# Patient Record
Sex: Male | Born: 1940 | Race: Black or African American | Hispanic: No | Marital: Single | Smoking: Former smoker
Health system: Southern US, Community
[De-identification: ages and names within clinical notes are randomized; demographics above are authoritative.]

## PROBLEM LIST (undated history)

## (undated) DIAGNOSIS — K4090 Unilateral inguinal hernia, without obstruction or gangrene, not specified as recurrent: Secondary | ICD-10-CM

## (undated) DIAGNOSIS — N390 Urinary tract infection, site not specified: Secondary | ICD-10-CM

## (undated) DIAGNOSIS — E876 Hypokalemia: Secondary | ICD-10-CM

## (undated) DIAGNOSIS — H548 Legal blindness, as defined in USA: Secondary | ICD-10-CM

## (undated) DIAGNOSIS — N289 Disorder of kidney and ureter, unspecified: Secondary | ICD-10-CM

## (undated) DIAGNOSIS — R066 Hiccough: Secondary | ICD-10-CM

## (undated) DIAGNOSIS — N400 Enlarged prostate without lower urinary tract symptoms: Secondary | ICD-10-CM

## (undated) DIAGNOSIS — I6350 Cerebral infarction due to unspecified occlusion or stenosis of unspecified cerebral artery: Secondary | ICD-10-CM

## (undated) DIAGNOSIS — I10 Essential (primary) hypertension: Secondary | ICD-10-CM

## (undated) DIAGNOSIS — A419 Sepsis, unspecified organism: Secondary | ICD-10-CM

## (undated) DIAGNOSIS — I639 Cerebral infarction, unspecified: Secondary | ICD-10-CM

## (undated) HISTORY — DX: Essential (primary) hypertension: I10

## (undated) HISTORY — DX: Cerebral infarction, unspecified: I63.9

## (undated) HISTORY — DX: Hypokalemia: E87.6

## (undated) HISTORY — DX: Sepsis, unspecified organism: A41.9

## (undated) HISTORY — PX: HERNIA REPAIR: SHX51

## (undated) HISTORY — DX: Disorders of magnesium metabolism, unspecified: E83.40

## (undated) HISTORY — DX: Hiccough: R06.6

## (undated) HISTORY — PX: GLAUCOMA SURGERY: SHX656

## (undated) HISTORY — DX: Benign prostatic hyperplasia without lower urinary tract symptoms: N40.0

## (undated) HISTORY — DX: Urinary tract infection, site not specified: N39.0

## (undated) HISTORY — DX: Disorder of kidney and ureter, unspecified: N28.9

## (undated) HISTORY — DX: Unilateral inguinal hernia, without obstruction or gangrene, not specified as recurrent: K40.90

---

## 1999-10-28 ENCOUNTER — Encounter: Admission: RE | Admit: 1999-10-28 | Discharge: 1999-10-28 | Payer: Self-pay | Admitting: Hematology and Oncology

## 1999-11-19 ENCOUNTER — Encounter: Admission: RE | Admit: 1999-11-19 | Discharge: 1999-11-19 | Payer: Self-pay | Admitting: Internal Medicine

## 2000-03-22 ENCOUNTER — Encounter: Admission: RE | Admit: 2000-03-22 | Discharge: 2000-03-22 | Payer: Self-pay | Admitting: Hematology and Oncology

## 2000-07-19 ENCOUNTER — Encounter: Admission: RE | Admit: 2000-07-19 | Discharge: 2000-07-19 | Payer: Self-pay | Admitting: Internal Medicine

## 2000-11-18 ENCOUNTER — Encounter: Admission: RE | Admit: 2000-11-18 | Discharge: 2000-11-18 | Payer: Self-pay | Admitting: Internal Medicine

## 2001-04-25 ENCOUNTER — Encounter: Admission: RE | Admit: 2001-04-25 | Discharge: 2001-04-25 | Payer: Self-pay

## 2001-11-29 ENCOUNTER — Encounter: Admission: RE | Admit: 2001-11-29 | Discharge: 2001-11-29 | Payer: Self-pay | Admitting: Internal Medicine

## 2001-12-18 ENCOUNTER — Encounter: Admission: RE | Admit: 2001-12-18 | Discharge: 2001-12-18 | Payer: Self-pay | Admitting: Internal Medicine

## 2002-07-23 ENCOUNTER — Encounter: Admission: RE | Admit: 2002-07-23 | Discharge: 2002-07-23 | Payer: Self-pay | Admitting: Internal Medicine

## 2003-01-21 ENCOUNTER — Encounter: Admission: RE | Admit: 2003-01-21 | Discharge: 2003-01-21 | Payer: Self-pay | Admitting: Internal Medicine

## 2003-01-25 ENCOUNTER — Encounter: Admission: RE | Admit: 2003-01-25 | Discharge: 2003-01-25 | Payer: Self-pay | Admitting: Internal Medicine

## 2003-06-05 ENCOUNTER — Inpatient Hospital Stay (HOSPITAL_COMMUNITY): Admission: EM | Admit: 2003-06-05 | Discharge: 2003-06-07 | Payer: Self-pay | Admitting: Emergency Medicine

## 2003-06-06 ENCOUNTER — Encounter: Payer: Self-pay | Admitting: Cardiology

## 2003-06-21 ENCOUNTER — Encounter: Admission: RE | Admit: 2003-06-21 | Discharge: 2003-06-21 | Payer: Self-pay | Admitting: Internal Medicine

## 2003-06-24 ENCOUNTER — Encounter: Admission: RE | Admit: 2003-06-24 | Discharge: 2003-06-24 | Payer: Self-pay | Admitting: Internal Medicine

## 2003-06-25 ENCOUNTER — Encounter: Admission: RE | Admit: 2003-06-25 | Discharge: 2003-06-25 | Payer: Self-pay | Admitting: Internal Medicine

## 2003-08-06 ENCOUNTER — Encounter: Admission: RE | Admit: 2003-08-06 | Discharge: 2003-08-06 | Payer: Self-pay | Admitting: Internal Medicine

## 2003-11-18 ENCOUNTER — Ambulatory Visit: Payer: Self-pay | Admitting: Internal Medicine

## 2004-04-30 ENCOUNTER — Ambulatory Visit: Payer: Self-pay | Admitting: Internal Medicine

## 2004-08-05 ENCOUNTER — Ambulatory Visit: Payer: Self-pay | Admitting: Internal Medicine

## 2004-08-06 ENCOUNTER — Encounter (INDEPENDENT_AMBULATORY_CARE_PROVIDER_SITE_OTHER): Payer: Self-pay | Admitting: Pulmonary Disease

## 2005-04-07 ENCOUNTER — Ambulatory Visit: Payer: Self-pay | Admitting: Internal Medicine

## 2005-05-17 ENCOUNTER — Ambulatory Visit: Payer: Self-pay | Admitting: Internal Medicine

## 2005-10-29 ENCOUNTER — Ambulatory Visit: Payer: Self-pay | Admitting: Internal Medicine

## 2005-10-29 ENCOUNTER — Encounter (INDEPENDENT_AMBULATORY_CARE_PROVIDER_SITE_OTHER): Payer: Self-pay | Admitting: Pulmonary Disease

## 2005-10-29 LAB — CONVERTED CEMR LAB
ALT: 16 units/L (ref 0–40)
AST: 29 units/L (ref 0–37)
BUN: 15 mg/dL (ref 6–23)
CO2: 27 meq/L (ref 19–32)
Calcium: 9.6 mg/dL (ref 8.4–10.5)
Glucose, Bld: 146 mg/dL — ABNORMAL HIGH (ref 70–99)
HCT: 45.1 % (ref 39.0–52.0)
Hemoglobin: 15.2 g/dL (ref 13.0–17.0)
Leukocyte count, blood: 14.6 10*9/L — ABNORMAL HIGH (ref 4.0–10.5)
MCHC: 33.7 g/dL (ref 30.0–36.0)
PSA: 2.35 ng/mL (ref 0.10–4.00)
RBC: 5.04 M/uL (ref 4.22–5.81)
TSH: 3.98 microintl units/mL (ref 0.350–5.50)
Total Protein: 7.9 g/dL (ref 6.0–8.3)

## 2005-11-22 ENCOUNTER — Encounter (INDEPENDENT_AMBULATORY_CARE_PROVIDER_SITE_OTHER): Payer: Self-pay | Admitting: Pulmonary Disease

## 2005-11-22 ENCOUNTER — Ambulatory Visit: Payer: Self-pay | Admitting: Hospitalist

## 2005-11-22 LAB — CONVERTED CEMR LAB: BUN: 20 mg/dL (ref 6–23)

## 2005-12-28 ENCOUNTER — Encounter (INDEPENDENT_AMBULATORY_CARE_PROVIDER_SITE_OTHER): Payer: Self-pay | Admitting: Pulmonary Disease

## 2005-12-28 DIAGNOSIS — I10 Essential (primary) hypertension: Secondary | ICD-10-CM

## 2005-12-28 DIAGNOSIS — N183 Chronic kidney disease, stage 3 (moderate): Secondary | ICD-10-CM

## 2005-12-28 DIAGNOSIS — K469 Unspecified abdominal hernia without obstruction or gangrene: Secondary | ICD-10-CM | POA: Insufficient documentation

## 2005-12-28 DIAGNOSIS — I498 Other specified cardiac arrhythmias: Secondary | ICD-10-CM

## 2005-12-28 DIAGNOSIS — I6350 Cerebral infarction due to unspecified occlusion or stenosis of unspecified cerebral artery: Secondary | ICD-10-CM

## 2005-12-28 DIAGNOSIS — K029 Dental caries, unspecified: Secondary | ICD-10-CM | POA: Insufficient documentation

## 2005-12-28 DIAGNOSIS — H548 Legal blindness, as defined in USA: Secondary | ICD-10-CM | POA: Insufficient documentation

## 2005-12-28 DIAGNOSIS — H409 Unspecified glaucoma: Secondary | ICD-10-CM | POA: Insufficient documentation

## 2005-12-28 HISTORY — DX: Cerebral infarction due to unspecified occlusion or stenosis of unspecified cerebral artery: I63.50

## 2006-03-31 ENCOUNTER — Encounter (INDEPENDENT_AMBULATORY_CARE_PROVIDER_SITE_OTHER): Payer: Self-pay | Admitting: Pulmonary Disease

## 2006-03-31 ENCOUNTER — Ambulatory Visit: Payer: Self-pay | Admitting: Internal Medicine

## 2006-03-31 DIAGNOSIS — Z862 Personal history of diseases of the blood and blood-forming organs and certain disorders involving the immune mechanism: Secondary | ICD-10-CM

## 2006-03-31 DIAGNOSIS — Z8639 Personal history of other endocrine, nutritional and metabolic disease: Secondary | ICD-10-CM

## 2006-03-31 HISTORY — DX: Disorders of magnesium metabolism, unspecified: E83.40

## 2006-03-31 LAB — CONVERTED CEMR LAB
Calcium: 10.4 mg/dL (ref 8.4–10.5)
Glucose, Bld: 103 mg/dL — ABNORMAL HIGH (ref 70–99)
Potassium: 3.5 meq/L (ref 3.5–5.3)

## 2006-04-06 ENCOUNTER — Ambulatory Visit: Payer: Self-pay | Admitting: Internal Medicine

## 2006-04-06 ENCOUNTER — Encounter (INDEPENDENT_AMBULATORY_CARE_PROVIDER_SITE_OTHER): Payer: Self-pay | Admitting: Pulmonary Disease

## 2006-04-06 LAB — CONVERTED CEMR LAB
Cholesterol: 143 mg/dL (ref 0–200)
HDL: 40 mg/dL (ref >39)
LDL Cholesterol: 75 mg/dL (ref 0–99)
Total CHOL/HDL Ratio: 3.6
Triglycerides: 138 mg/dL (ref <150)
VLDL: 28 mg/dL (ref 0–40)

## 2006-04-25 ENCOUNTER — Telehealth (INDEPENDENT_AMBULATORY_CARE_PROVIDER_SITE_OTHER): Payer: Self-pay | Admitting: *Deleted

## 2006-06-29 ENCOUNTER — Telehealth (INDEPENDENT_AMBULATORY_CARE_PROVIDER_SITE_OTHER): Payer: Self-pay | Admitting: *Deleted

## 2006-08-04 ENCOUNTER — Ambulatory Visit: Payer: Self-pay | Admitting: Internal Medicine

## 2006-08-04 ENCOUNTER — Encounter (INDEPENDENT_AMBULATORY_CARE_PROVIDER_SITE_OTHER): Payer: Self-pay | Admitting: *Deleted

## 2006-08-04 DIAGNOSIS — N400 Enlarged prostate without lower urinary tract symptoms: Secondary | ICD-10-CM

## 2006-08-04 LAB — CONVERTED CEMR LAB
CO2: 23 meq/L (ref 19–32)
Chloride: 99 meq/L (ref 96–112)
Creatinine, Ser: 1.64 mg/dL — ABNORMAL HIGH (ref 0.40–1.50)
Glucose, Bld: 74 mg/dL (ref 70–99)
Potassium: 3.5 meq/L (ref 3.5–5.3)
Sodium: 139 meq/L (ref 135–145)
TSH: 2.494 microintl units/mL (ref 0.350–5.50)

## 2006-10-25 ENCOUNTER — Telehealth (INDEPENDENT_AMBULATORY_CARE_PROVIDER_SITE_OTHER): Payer: Self-pay | Admitting: *Deleted

## 2006-11-25 ENCOUNTER — Telehealth: Payer: Self-pay | Admitting: *Deleted

## 2006-12-16 ENCOUNTER — Ambulatory Visit: Payer: Self-pay | Admitting: Hospitalist

## 2006-12-16 ENCOUNTER — Encounter (INDEPENDENT_AMBULATORY_CARE_PROVIDER_SITE_OTHER): Payer: Self-pay | Admitting: *Deleted

## 2006-12-16 LAB — CONVERTED CEMR LAB
AST: 23 units/L (ref 0–37)
Basophils Relative: 0 % (ref 0–1)
CO2: 22 meq/L (ref 19–32)
Calcium: 10 mg/dL (ref 8.4–10.5)
Chloride: 102 meq/L (ref 96–112)
Creatinine, Ser: 1.41 mg/dL (ref 0.40–1.50)
HDL: 46 mg/dL (ref >39)
Hemoglobin: 15.1 g/dL (ref 13.0–17.0)
MCV: 86.1 fL (ref 78.0–100.0)
Monocytes Absolute: 0.9 10*3/uL (ref 0.1–1.0)
Platelets: 253 10*3/uL (ref 150–400)
Total Protein: 8.3 g/dL (ref 6.0–8.3)
VLDL: 38 mg/dL (ref 0–40)

## 2007-06-08 ENCOUNTER — Encounter (INDEPENDENT_AMBULATORY_CARE_PROVIDER_SITE_OTHER): Payer: Self-pay | Admitting: *Deleted

## 2007-06-08 ENCOUNTER — Ambulatory Visit: Payer: Self-pay | Admitting: Internal Medicine

## 2007-06-11 LAB — CONVERTED CEMR LAB
BUN: 22 mg/dL (ref 6–23)
CO2: 23 meq/L (ref 19–32)
Cholesterol: 153 mg/dL (ref 0–200)
Creatinine, Ser: 1.42 mg/dL (ref 0.40–1.50)
Glucose, Bld: 108 mg/dL — ABNORMAL HIGH (ref 70–99)
LDL Cholesterol: 76 mg/dL (ref 0–99)
Sodium: 140 meq/L (ref 135–145)
Triglycerides: 177 mg/dL — ABNORMAL HIGH (ref <150)

## 2007-11-20 ENCOUNTER — Telehealth (INDEPENDENT_AMBULATORY_CARE_PROVIDER_SITE_OTHER): Payer: Self-pay | Admitting: *Deleted

## 2008-01-10 ENCOUNTER — Encounter (INDEPENDENT_AMBULATORY_CARE_PROVIDER_SITE_OTHER): Payer: Self-pay | Admitting: *Deleted

## 2008-01-10 ENCOUNTER — Ambulatory Visit: Payer: Self-pay | Admitting: Infectious Diseases

## 2008-01-10 LAB — CONVERTED CEMR LAB
CO2: 23 meq/L (ref 19–32)
Chloride: 100 meq/L (ref 96–112)
Potassium: 3.6 meq/L (ref 3.5–5.3)
Sodium: 138 meq/L (ref 135–145)

## 2008-08-19 ENCOUNTER — Encounter: Payer: Self-pay | Admitting: Internal Medicine

## 2008-08-19 ENCOUNTER — Ambulatory Visit: Payer: Self-pay | Admitting: Internal Medicine

## 2008-08-19 LAB — CONVERTED CEMR LAB
AST: 23 units/L (ref 0–37)
BUN: 14 mg/dL (ref 6–23)
Creatinine, Ser: 1.4 mg/dL (ref 0.40–1.50)
Glucose, Bld: 96 mg/dL (ref 70–99)
HCT: 42.6 % (ref 39.0–52.0)
HDL: 46 mg/dL (ref >39)
LDL Cholesterol: 83 mg/dL (ref 0–99)
MCV: 85 fL (ref >=78.0)
Magnesium: 1.9 mg/dL (ref 1.5–2.5)
Platelets: 282 10*3/uL (ref 150–400)
RDW: 13.8 % (ref 11.5–15.5)
Sodium: 140 meq/L (ref 135–145)
Total Bilirubin: 0.6 mg/dL (ref 0.3–1.2)
Total Protein: 7.9 g/dL (ref 6.0–8.3)
WBC: 9.1 10*3/uL (ref 4.0–10.5)

## 2008-08-20 ENCOUNTER — Telehealth: Payer: Self-pay | Admitting: Internal Medicine

## 2008-11-12 ENCOUNTER — Telehealth: Payer: Self-pay | Admitting: Internal Medicine

## 2009-01-14 ENCOUNTER — Telehealth: Payer: Self-pay | Admitting: Internal Medicine

## 2009-01-15 ENCOUNTER — Ambulatory Visit: Payer: Self-pay | Admitting: Internal Medicine

## 2009-01-15 DIAGNOSIS — R066 Hiccough: Secondary | ICD-10-CM | POA: Insufficient documentation

## 2009-01-15 DIAGNOSIS — R05 Cough: Secondary | ICD-10-CM | POA: Insufficient documentation

## 2009-08-12 ENCOUNTER — Telehealth: Payer: Self-pay | Admitting: Internal Medicine

## 2009-08-21 ENCOUNTER — Ambulatory Visit: Payer: Self-pay | Admitting: Internal Medicine

## 2009-08-21 LAB — CONVERTED CEMR LAB
Albumin: 4.4 g/dL (ref 3.5–5.2)
Alkaline Phosphatase: 70 units/L (ref 39–117)
CO2: 23 meq/L (ref 19–32)
Chloride: 100 meq/L (ref 96–112)
Cholesterol: 172 mg/dL (ref 0–200)
Creatinine, Ser: 1.47 mg/dL (ref 0.40–1.50)
Hemoglobin: 14.9 g/dL (ref 13.0–17.0)
LDL Cholesterol: 94 mg/dL (ref 0–99)
MCHC: 34.3 g/dL (ref 30.0–36.0)
MCV: 85.3 fL (ref >=78.0)
Platelets: 265 10*3/uL (ref 150–400)
Potassium: 3.7 meq/L (ref 3.5–5.3)
Sodium: 137 meq/L (ref 135–145)
Total Bilirubin: 0.5 mg/dL (ref 0.3–1.2)
Total CHOL/HDL Ratio: 3.9
Total Protein: 7.9 g/dL (ref 6.0–8.3)
Triglycerides: 170 mg/dL — ABNORMAL HIGH (ref <150)

## 2009-08-25 ENCOUNTER — Telehealth: Payer: Self-pay | Admitting: Internal Medicine

## 2010-01-27 ENCOUNTER — Telehealth: Payer: Self-pay | Admitting: Internal Medicine

## 2010-02-10 NOTE — Assessment & Plan Note (Signed)
Summary: est-ck/fu/meds/cfb   Vital Signs:  Patient profile:   70 year old male Height:      70 inches Weight:      180.3 pounds BMI:     25.96 Temp:     97.8 degrees F oral Pulse rate:   96 / minute BP sitting:   118 / 74  (right arm)  Vitals Entered By: Filomena Jungling NT II (August 21, 2009 4:00 PM) CC: COUGH/ NEED FORM FILLED Is Patient Diabetic? No Pain Assessment Patient in pain? no      Nutritional Status BMI of 25 - 29 = overweight  Have you ever been in a relationship where you felt threatened, hurt or afraid?No   Does patient need assistance? Ambulation Impaired:Risk for fall, Wheelchair Comments sister helps with patient,patient is blind   CC:  COUGH/ NEED FORM FILLED.  History of Present Illness: 70 yr old man with pmhx as described below comes to the clinic complaining of seasonal dry cough. Patient usually gets a cough suppresant that relieves it.  Denies fever or chills, shortness of breath, chest pain, abdominal pain, or diarrhea.   Preventive Screening-Counseling & Management  Alcohol-Tobacco     Smoking Status: quit     Packs/Day: 30Pyh     Year Quit: 40 years ago  Caffeine-Diet-Exercise     Caffeine use/day: 4     Does Patient Exercise: yes     Type of exercise: range of motion  Problems Prior to Update: 1)  Hiccups, Chronic  (ICD-786.8) 2)  Cough  (ICD-786.2) 3)  Benign Prostatic Hypertrophy, Hx of  (ICD-V13.8) 4)  Preventive Health Care  (ICD-V70.0) 5)  Hypomagnesemia  (ICD-275.2) 6)  Hypokalemia, Hx of  (ICD-V12.2) 7)  Dental Caries  (ICD-521.00) 8)  Sinus Tachycardia  (ICD-427.89) 9)  Hernia  (ICD-553.9) 10)  Cva  (ICD-434.91) 11)  Blindness  (ICD-369.4) 12)  Glaucoma  (ICD-365.9) 13)  Renal Insufficiency  (ICD-588.9) 14)  Hypertension  (ICD-401.9)  Medications Prior to Update: 1)  Toprol Xl 100 Mg Tb24 (Metoprolol Succinate) .... Take 1 Tablet By Mouth Once A Day 2)  Accupril 40 Mg Tabs (Quinapril Hcl) .... Take 1 Tablet By Mouth  Once A Day 3)  Norvasc 10 Mg Tabs (Amlodipine Besylate) .... Take 1 Tablet By Mouth Once A Day 4)  Chlorpromazine Hcl 25 Mg Tabs (Chlorpromazine Hcl) .... Take 25mg  By Mouth Three Times Daily As Needed For Hiccups. 5)  Maxzide-25 37.5-25 Mg Tabs (Triamterene-Hctz) .... Take 1 Tablet By Mouth Once A Day  Current Medications (verified): 1)  Toprol Xl 100 Mg Tb24 (Metoprolol Succinate) .... Take 1 Tablet By Mouth Once A Day 2)  Accupril 40 Mg Tabs (Quinapril Hcl) .... Take 1 Tablet By Mouth Once A Day 3)  Norvasc 10 Mg Tabs (Amlodipine Besylate) .... Take 1 Tablet By Mouth Once A Day 4)  Chlorpromazine Hcl 25 Mg Tabs (Chlorpromazine Hcl) .... Take 25mg  By Mouth Three Times Daily As Needed For Hiccups. 5)  Maxzide-25 37.5-25 Mg Tabs (Triamterene-Hctz) .... Take 1 Tablet By Mouth Once A Day  Allergies: No Known Drug Allergies  Past History:  Past Medical History: Last updated: 12/28/2005 Hypertension Renal insufficiency (bseline Cr. 1.5)  Sinus Tachycardia (120BPM) Poor Dentition CVA Glaucoma Blindness Hernia   Risk Factors: Caffeine Use: 4 (08/21/2009) Exercise: yes (08/21/2009)  Risk Factors: Smoking Status: quit (08/21/2009) Packs/Day: 30Pyh (08/21/2009)  Review of Systems  The patient denies fever, chest pain, dyspnea on exertion, peripheral edema, headaches, hemoptysis, abdominal pain, melena, hematochezia, hematuria,  and difficulty walking.    Physical Exam  General:  NAD Eyes:  blind  Mouth:  pharynx pink and moist, no erythema, and no exudates.   Neck:  supple and no cervical lymphadenopathy.   Lungs:  Normal respiratory effort, chest expands symmetrically. Lungs are clear to auscultation, no crackles or wheezes. Heart:  Normal rate and regular rhythm. S1 and S2 normal without gallop, murmur, click, rub or other extra sounds. Abdomen:  soft, non-tender, and normal bowel sounds.   Extremities:  no edema   Impression & Recommendations:  Problem # 1:  COUGH  (ICD-786.2) Most likely 2/2 post nasal drip. Will give an antihistamine for symptom relief and reasses on follow up. Instructed to return to the clinic if cough worsens. Will follow up and consider further managment and imaging if indicated.  Problem # 2:  HYPERTENSION (ICD-401.9) Stable. Continue current regimen.  His updated medication list for this problem includes:    Toprol Xl 100 Mg Tb24 (Metoprolol succinate) .Marland Kitchen... Take 1 tablet by mouth once a day    Accupril 40 Mg Tabs (Quinapril hcl) .Marland Kitchen... Take 1 tablet by mouth once a day    Norvasc 10 Mg Tabs (Amlodipine besylate) .Marland Kitchen... Take 1 tablet by mouth once a day    Maxzide-25 37.5-25 Mg Tabs (Triamterene-hctz) .Marland Kitchen... Take 1 tablet by mouth once a day  Orders: T-CBC No Diff ([id number]) T-CMP with Estimated GFR ([id number])  BP today: 118/74 Prior BP: 116/83 (01/15/2009)  Labs Reviewed: K+: 3.7 (08/19/2008) Creat: : 1.40 (08/19/2008)   Chol: 156 (08/19/2008)   HDL: 46 (08/19/2008)   LDL: 83 (08/19/2008)   TG: 133 (08/19/2008)  Problem # 3:  HYPOKALEMIA, HX OF (ICD-V12.2) Check labs and reassess.  Problem # 4:  RENAL INSUFFICIENCY (ICD-588.9) Check renal function and reassess.  Problem # 5:  Preventive Health Care (ICD-V70.0) Check Fasting lipid panel and routine lab work. Patient reports to have had a Colonoscopy in the past which per his report was normal. Will get records.   Complete Medication List: 1)  Toprol Xl 100 Mg Tb24 (Metoprolol succinate) .... Take 1 tablet by mouth once a day 2)  Accupril 40 Mg Tabs (Quinapril hcl) .... Take 1 tablet by mouth once a day 3)  Norvasc 10 Mg Tabs (Amlodipine besylate) .... Take 1 tablet by mouth once a day 4)  Chlorpromazine Hcl 25 Mg Tabs (Chlorpromazine hcl) .... Take 25mg  by mouth three times daily as needed for hiccups. 5)  Maxzide-25 37.5-25 Mg Tabs (Triamterene-hctz) .... Take 1 tablet by mouth once a day 6)  Aller-chlor 2 Mg/62ml Syrp (Chlorpheniramine maleate) .... Take 10ml by  mouth q 6 hrs as needed for cough  Other Orders: T-Lipid Profile ([id number])  Patient Instructions: 1)  Please schedule a follow-up appointment in 6 months. 2)  Take all medication as directed. Prescriptions: ALLER-CHLOR 2 MG/5ML SYRP (CHLORPHENIRAMINE MALEATE) Take 10ml by mouth q 6 hrs as needed for cough  #118 ml x 0   Entered and Authorized by:   Laren Everts MD   Signed by:   Laren Everts MD on 08/25/2009   Method used:   Electronically to        [facility] [address] (retail)       [phone]       [place], [place]  [address]       Ph: [phone]       Fax: [phone]   RxID:   [id number]  PENNKINETIC ER 8-10 MG/5ML LQCR (CHLORPHENIRAMINE-HYDROCODONE) Take 5ml by mouth two times a day as needed coughing  #77ml x 1   Entered and Authorized by:   Laren Everts MD   Signed by:   Laren Everts MD on 08/21/2009   Method used:   Print then Give to Patient   RxID:   [id number] MAXZIDE-25 37.5-25 MG TABS (TRIAMTERENE-HCTZ) Take 1 tablet by mouth once a day  #30 x 6   Entered and Authorized by:   Laren Everts MD   Signed by:   Laren Everts MD on 08/21/2009   Method used:   Electronically to        [facility] [address] (retail)       [phone]       [place], [place]  [address]       Ph: [phone]       Fax: [phone]   RxID:   [id number] CHLORPROMAZINE HCL 25 MG TABS (CHLORPROMAZINE HCL) Take 25mg  by mouth three times daily as needed for hiccups.  #90 x 5   Entered and Authorized by:   Laren Everts MD   Signed by:   Laren Everts MD on 08/21/2009   Method used:   Electronically to        [facility] [address] (retail)       [phone]       [place], [place]  [address]       Ph: [phone]       Fax: [phone]   RxID:   [id number] NORVASC 10 MG  TABS (AMLODIPINE BESYLATE) Take 1 tablet by mouth once a day  #30 x 11   Entered and Authorized by:   Laren Everts MD   Signed by:   Laren Everts MD on 08/21/2009   Method used:   Electronically to        [facility] [address] (retail)       [phone]       [place], [place]  [address]       Ph: [phone]       Fax: [phone]   RxID:   [id number] ACCUPRIL 40 MG TABS (QUINAPRIL HCL) Take 1 tablet by mouth once a day  #30 Each x 11   Entered and Authorized by:   Laren Everts MD   Signed by:   Laren Everts MD on 08/21/2009   Method used:   Electronically to        [facility] [address] (retail)       [phone]       [place], [place]  [address]       Ph: [phone]       Fax: [phone]   RxID:   [id number] TOPROL XL 100 MG TB24 (METOPROLOL SUCCINATE) Take 1 tablet by mouth once a day  #30 x 11   Entered and Authorized by:   Laren Everts MD   Signed by:   Laren Everts MD on 08/21/2009   Method used:   Electronically to        [facility] [address] (retail)       [phone]       [place], [place]  [address]  Ph: [phone]       Fax: [phone]   RxID:   [id number]   Prevention & Chronic Care Immunizations   Influenza vaccine: Fluvax MCR  (01/15/2009)    Tetanus booster: Not documented    Pneumococcal vaccine: Not documented    H. zoster vaccine: Not documented  Colorectal Screening   Hemoccult: Not documented    Colonoscopy: Not documented  Other Screening   PSA: 2.34  (08/04/2006)   Smoking status: quit  (08/21/2009)  Lipids   Total Cholesterol: 156  (08/19/2008)   LDL: 83  (08/19/2008)   LDL Direct: Not documented   HDL: 46  (08/19/2008)   Triglycerides: 133  (08/19/2008)  Hypertension   Last Blood Pressure: 118 / 74  (08/21/2009)   Serum creatinine: 1.40   (08/19/2008)   Serum potassium 3.7  (08/19/2008)    Hypertension flowsheet reviewed?: Yes   Progress toward BP goal: At goal  Self-Management Support :   Personal Goals (by the next clinic visit) :      Personal blood pressure goal: 130/80  (01/15/2009)   Patient will work on the following items until the next clinic visit to reach self-care goals:     Medications and monitoring: take my medicines every day  (08/21/2009)     Eating: drink diet soda or water instead of juice or soda, eat more vegetables, use fresh or frozen vegetables, eat foods that are low in salt, eat baked foods instead of fried foods, eat fruit for snacks and desserts  (08/21/2009)     Activity: take a 30 minute walk every day  (08/21/2009)    Hypertension self-management support: Education handout, Written self-care plan  (08/21/2009)   Hypertension self-care plan printed.   Hypertension education handout printed   Process Orders Check Orders Results:     [facility]: Check successful Tests Sent for requisitioning (August 25, 2009 6:08 PM):     08/21/2009: [facility] -- T-CBC No Diff [[id number]] (signed)     08/21/2009: [facility] -- T-CMP with Estimated GFR [[id number]] (signed)     08/21/2009: [facility] -- T-Lipid Profile [id number] (signed)

## 2010-02-10 NOTE — Progress Notes (Signed)
Summary: Refill/gh  Phone Note Refill Request Message from:  Fax from Pharmacy on January 14, 2009 4:11 PM  Refills Requested: Medication #1:  MAXZIDE-25 37.5-25 MG TABS Take 1 tablet by mouth once a day.   Last Refilled: 12/11/2008  Method Requested: Electronic Initial call taken by: Angelina Ok RN,  January 14, 2009 4:11 PM    Prescriptions: MAXZIDE-25 37.5-25 MG TABS (TRIAMTERENE-HCTZ) Take 1 tablet by mouth once a day  #30 x 6   Entered and Authorized by:   Laren Everts MD   Signed by:   Laren Everts MD on 01/15/2009   Method used:   Electronically to        [facility] [address] (retail)       [phone]       [place], [place]  [address]       Ph: [phone]       Fax: [phone]   RxID:   [id number]

## 2010-02-10 NOTE — Assessment & Plan Note (Signed)
Summary: f/u [mkj[   Vital Signs:  Patient profile:   70 year old male Height:      70 inches (177.80 cm) Weight:      181.0 pounds (82.27 kg) BMI:     26.06 Temp:     98.1 degrees F (36.72 degrees C) oral Pulse rate:   83 / minute BP sitting:   116 / 83  (left arm)  Vitals Entered By: Stanton Kidney Ditzler RN (January 15, 2009 3:57 PM) Is Patient Diabetic? No Pain Assessment Patient in pain? no      Nutritional Status BMI of 25 - 29 = overweight Nutritional Status Detail appetite good  Have you ever been in a relationship where you felt threatened, hurt or afraid?denies   Does patient need assistance? Functional Status Self care Ambulation Impaired:Risk for fall, Wheelchair Comments In w/c - uses a cane. Pt blind. Sister with pt also she is caregiver. Refills on meds and nonproductive cough mostly at night for past week. Wants labs.   History of Present Illness: 70 yr old man with pmhx as described below comes to the clinic follow up. Patient reports to have a dry cough about a week ago. Denies any fever, chills, chest pain, abdominal pain, palpitations, diarrhea, or constipation.  Depression History:      The patient denies a depressed mood most of the day and a diminished interest in his usual daily activities.         Preventive Screening-Counseling & Management  Alcohol-Tobacco     Smoking Status: quit     Packs/Day: 30Pyh     Year Quit: 40 years ago  Caffeine-Diet-Exercise     Caffeine use/day: 4     Does Patient Exercise: yes     Type of exercise: range of motion  Problems Prior to Update: 1)  Benign Prostatic Hypertrophy, Hx of  (ICD-V13.8) 2)  Preventive Health Care  (ICD-V70.0) 3)  Hypomagnesemia  (ICD-275.2) 4)  Hypokalemia, Hx of  (ICD-V12.2) 5)  Dental Caries  (ICD-521.00) 6)  Sinus Tachycardia  (ICD-427.89) 7)  Hernia  (ICD-553.9) 8)  Cva  (ICD-434.91) 9)  Blindness  (ICD-369.4) 10)  Glaucoma  (ICD-365.9) 11)  Renal Insufficiency  (ICD-588.9) 12)   Hypertension  (ICD-401.9)  Medications Prior to Update: 1)  Toprol Xl 100 Mg Tb24 (Metoprolol Succinate) .... Take 1 Tablet By Mouth Once A Day 2)  Accupril 40 Mg Tabs (Quinapril Hcl) .... Take 1 Tablet By Mouth Once A Day 3)  Norvasc 10 Mg Tabs (Amlodipine Besylate) .... Take 1 Tablet By Mouth Once A Day 4)  Chlorpromazine Hcl 25 Mg Tabs (Chlorpromazine Hcl) .... Take 25mg  By Mouth Three Times Daily As Needed For Hiccups. 5)  Maxzide-25 37.5-25 Mg Tabs (Triamterene-Hctz) .... Take 1 Tablet By Mouth Once A Day  Current Medications (verified): 1)  Toprol Xl 100 Mg Tb24 (Metoprolol Succinate) .... Take 1 Tablet By Mouth Once A Day 2)  Accupril 40 Mg Tabs (Quinapril Hcl) .... Take 1 Tablet By Mouth Once A Day 3)  Norvasc 10 Mg Tabs (Amlodipine Besylate) .... Take 1 Tablet By Mouth Once A Day 4)  Chlorpromazine Hcl 25 Mg Tabs (Chlorpromazine Hcl) .... Take 25mg  By Mouth Three Times Daily As Needed For Hiccups. 5)  Maxzide-25 37.5-25 Mg Tabs (Triamterene-Hctz) .... Take 1 Tablet By Mouth Once A Day  Allergies: No Known Drug Allergies  Past History:  Past Medical History: Last updated: 12/28/2005 Hypertension Renal insufficiency (bseline Cr. 1.5)  Sinus Tachycardia (120BPM) Poor Dentition CVA  Glaucoma Blindness Hernia   Risk Factors: Caffeine Use: 4 (01/15/2009) Exercise: yes (01/15/2009)  Risk Factors: Smoking Status: quit (01/15/2009) Packs/Day: 30Pyh (01/15/2009)  Review of Systems  The patient denies fever, chest pain, syncope, dyspnea on exertion, peripheral edema, headaches, hemoptysis, abdominal pain, melena, hematochezia, hematuria, and muscle weakness.    Physical Exam  General:  NAD Head:  normocephalic and atraumatic.   Eyes:  blind  Ears:  no external deformities.   Nose:  no external deformity.   Neck:  supple and full ROM.   Lungs:  Normal respiratory effort, chest expands symmetrically. Lungs are clear to auscultation, no crackles or wheezes. Heart:   Normal rate and regular rhythm. S1 and S2 normal without gallop, murmur, click, rub or other extra sounds. Abdomen:  soft, non-tender, and normal bowel sounds.   Extremities:  no edema Neurologic:  alert & oriented X3.     Impression & Recommendations:  Problem # 1:  HYPERTENSION (ICD-401.9) Controlled. Continue current regimen.  His updated medication list for this problem includes:    Toprol Xl 100 Mg Tb24 (Metoprolol succinate) .Marland Kitchen... Take 1 tablet by mouth once a day    Accupril 40 Mg Tabs (Quinapril hcl) .Marland Kitchen... Take 1 tablet by mouth once a day    Norvasc 10 Mg Tabs (Amlodipine besylate) .Marland Kitchen... Take 1 tablet by mouth once a day    Maxzide-25 37.5-25 Mg Tabs (Triamterene-hctz) .Marland Kitchen... Take 1 tablet by mouth once a day  BP today: 116/83 Prior BP: 135/92 (08/19/2008)  Labs Reviewed: K+: 3.7 (08/19/2008) Creat: : 1.40 (08/19/2008)   Chol: 156 (08/19/2008)   HDL: 46 (08/19/2008)   LDL: 83 (08/19/2008)   TG: 133 (08/19/2008)  Problem # 2:  RENAL INSUFFICIENCY (ICD-588.9) Stable. Will continue to monitor renal function on subsequent visit.  Problem # 3:  COUGH (ICD-786.2) Likely 2/2 viral URTI. Patient was instructed to drink plenty of fluids and to treat cough with mucinex if needed.   Problem # 4:  HICCUPS, CHRONIC (ICD-786.8) Continue chlorpromazine.   Problem # 5:  PREVENTIVE HEALTH CARE (ICD-V70.0) Reviewed preventive care protocols, and updated immunizations. Flu shot given. Will give Pneumovax on follow up.  Complete Medication List: 1)  Toprol Xl 100 Mg Tb24 (Metoprolol succinate) .... Take 1 tablet by mouth once a day 2)  Accupril 40 Mg Tabs (Quinapril hcl) .... Take 1 tablet by mouth once a day 3)  Norvasc 10 Mg Tabs (Amlodipine besylate) .... Take 1 tablet by mouth once a day 4)  Chlorpromazine Hcl 25 Mg Tabs (Chlorpromazine hcl) .... Take 25mg  by mouth three times daily as needed for hiccups. 5)  Maxzide-25 37.5-25 Mg Tabs (Triamterene-hctz) .... Take 1 tablet by mouth  once a day  Other Orders: Influenza Vaccine MCR ([id number])  Patient Instructions: 1)  Please schedule a follow-up appointment in 6 months. 2)  Continue to take all medication as directed. Prescriptions: CHLORPROMAZINE HCL 25 MG TABS (CHLORPROMAZINE HCL) Take 25mg  by mouth three times daily as needed for hiccups.  #90 x 5   Entered and Authorized by:   Laren Everts MD   Signed by:   Laren Everts MD on 01/15/2009   Method used:   Electronically to        [facility] [address] (retail)       [phone]       [place], [place]  [address]       Ph: [phone]  Fax: [phone]   RxID:   [id number] NORVASC 10 MG TABS (AMLODIPINE BESYLATE) Take 1 tablet by mouth once a day  #30 x 11   Entered and Authorized by:   Laren Everts MD   Signed by:   Laren Everts MD on 01/15/2009   Method used:   Electronically to        [facility] [address] (retail)       [phone]       [place], [place]  [address]       Ph: [phone]       Fax: [phone]   RxID:   [id number] ACCUPRIL 40 MG TABS (QUINAPRIL HCL) Take 1 tablet by mouth once a day  #30 Each x 11   Entered and Authorized by:   Laren Everts MD   Signed by:   Laren Everts MD on 01/15/2009   Method used:   Electronically to        [facility] [address] (retail)       [phone]       [place], [place]  [address]       Ph: [phone]       Fax: [phone]   RxID:   [id number]    Prevention & Chronic Care Immunizations   Influenza vaccine: Fluvax MCR  (01/15/2009)    Tetanus booster: Not documented    Pneumococcal vaccine: Not documented    H. zoster vaccine: Not documented  Colorectal Screening   Hemoccult: Not documented    Colonoscopy: Not documented  Other Screening   PSA: 2.34  (08/04/2006)   Smoking status: quit   (01/15/2009)  Lipids   Total Cholesterol: 156  (08/19/2008)   LDL: 83  (08/19/2008)   LDL Direct: Not documented   HDL: 46  (08/19/2008)   Triglycerides: 133  (08/19/2008)  Hypertension   Last Blood Pressure: 116 / 83  (01/15/2009)   Serum creatinine: 1.40  (08/19/2008)   Serum potassium 3.7  (08/19/2008)    Hypertension flowsheet reviewed?: Yes   Progress toward BP goal: At goal  Self-Management Support :   Personal Goals (by the next clinic visit) :      Personal blood pressure goal: 130/80  (01/15/2009)   Patient will work on the following items until the next clinic visit to reach self-care goals:     Medications and monitoring: take my medicines every day, check my blood pressure, bring all of my medications to every visit, weigh myself weekly  (01/15/2009)     Eating: eat more vegetables, use fresh or frozen vegetables, eat foods that are low in salt, eat baked foods instead of fried foods, eat fruit for snacks and desserts, limit or avoid alcohol  (01/15/2009)     Activity: take a 30 minute walk every day, take the stairs instead of the elevator, park at the far end of the parking lot  (01/15/2009)    Hypertension self-management support: Written self-care plan  (01/15/2009)   Hypertension self-care plan printed.   Nursing Instructions: Give Flu vaccine today    Influenza Vaccine    Vaccine Type: Fluvax MCR    Site: left deltoid    Mfr: novartis    Dose: 0.5 ml    Route: IM    Given by: Stanton Kidney Ditzler RN    Exp. Date: 03/11/2009    Lot #: [id number]    VIS given: 08/04/06 version  given January 15, 2009.  Flu Vaccine Consent Questions    Do you have a history of severe allergic reactions to this vaccine? no    Any prior history of allergic reactions to egg and/or gelatin? no    Do you have a sensitivity to the preservative Thimersol? no    Do you have a past history of Guillan-Barre Syndrome? no    Do you currently have an acute febrile illness? no    Have you  ever had a severe reaction to latex? no    Vaccine information given and explained to patient? yes

## 2010-02-10 NOTE — Progress Notes (Signed)
Summary: refill/gg  Phone Note Refill Request  on August 12, 2009 4:58 PM  Refills Requested: Medication #1:  MAXZIDE-25 37.5-25 MG TABS Take 1 tablet by mouth once a day.  Method Requested: Electronic Initial call taken by: Merrie Roof RN,  August 12, 2009 4:58 PM    Prescriptions: MAXZIDE-25 37.5-25 MG TABS (TRIAMTERENE-HCTZ) Take 1 tablet by mouth once a day  #30 x 6   Entered and Authorized by:   Laren Everts MD   Signed by:   Laren Everts MD on 08/13/2009   Method used:   Electronically to        [facility] [address] (retail)       [phone]       [place], [place]  [address]       Ph: [phone]       Fax: [phone]   RxID:   [id number]

## 2010-02-10 NOTE — Progress Notes (Signed)
Summary: phone/gg  Phone Note Call from Patient   Summary of Call: Pt's caregiver called and states pt's insurance will not cover TUSSIONEX PENNKINETIC ER 8-10 MG/5ML LQCR .  He needs something not codeine based. Initial call taken by: Merrie Roof RN,  August 25, 2009 4:08 PM    Changed to Aller-Chlor. Patient to take as directed.

## 2010-02-12 NOTE — Progress Notes (Signed)
Summary: refill/gg  Phone Note Refill Request  on January 27, 2010 12:10 PM  Refills Requested: Medication #1:  CHLORPROMAZINE HCL 25 MG TABS Take 25mg  by mouth three times daily as needed for hiccups.   Last Refilled: 01/24/2010  Method Requested: Electronic Initial call taken by: Merrie Roof RN,  January 27, 2010 12:11 PM  Follow-up for Phone Call        Refill approved-nurse to complete Follow-up by: Ulyess Mort MD,  January 27, 2010 3:48 PM    Prescriptions: CHLORPROMAZINE HCL 25 MG TABS (CHLORPROMAZINE HCL) Take 25mg  by mouth three times daily as needed for hiccups.  #90 x 5   Entered and Authorized by:   Ulyess Mort MD   Signed by:   Ulyess Mort MD on 01/27/2010   Method used:   Electronically to        [facility] [address] (retail)       [phone]       [place], [place]  [address]       Ph: [phone]       Fax: [phone]   RxID:   [id number]

## 2010-04-06 ENCOUNTER — Encounter: Payer: Self-pay | Admitting: Internal Medicine

## 2010-04-06 ENCOUNTER — Ambulatory Visit (INDEPENDENT_AMBULATORY_CARE_PROVIDER_SITE_OTHER): Payer: PRIVATE HEALTH INSURANCE | Admitting: Internal Medicine

## 2010-04-06 DIAGNOSIS — I6350 Cerebral infarction due to unspecified occlusion or stenosis of unspecified cerebral artery: Secondary | ICD-10-CM

## 2010-04-06 DIAGNOSIS — I10 Essential (primary) hypertension: Secondary | ICD-10-CM

## 2010-04-06 DIAGNOSIS — Z0000 Encounter for general adult medical examination without abnormal findings: Secondary | ICD-10-CM

## 2010-04-06 DIAGNOSIS — N259 Disorder resulting from impaired renal tubular function, unspecified: Secondary | ICD-10-CM

## 2010-04-06 DIAGNOSIS — Z23 Encounter for immunization: Secondary | ICD-10-CM

## 2010-04-06 LAB — BASIC METABOLIC PANEL
Calcium: 10.1 mg/dL (ref 8.4–10.5)
Chloride: 101 mEq/L (ref 96–112)
Sodium: 139 mEq/L (ref 135–145)

## 2010-04-06 NOTE — Assessment & Plan Note (Addendum)
Patient given flu and pneumovax vaccine today. Called every Gastroenterology's office to get records of colonoscopy but we were unsuccessful. Went through old chart which mentions patient had a Colonoscopy in 2006 that was normal but there was no study found. Will have patient complete home hemoccult cards on follow up.

## 2010-04-06 NOTE — Progress Notes (Signed)
  Subjective:    Patient ID: Austin Baker, male    DOB: 1940/02/09, 70 y.o.   MRN: [id number]  HPI  70 yo man with  Past Medical History  Diagnosis Date  . Hiccups   . BPH (benign prostatic hypertrophy)   . Hypomagnesemia   . Hypokalemia   . CVA (cerebral infarction)   . Hypertension   . Glaucoma   . Urosepsis     history of  . Hernia   . Renal insufficiency     Baseline Cr 1.5   comes to the clinic for regular check up of chronic medication problems. Patient has no complains.   Review of Systems  [all other systems reviewed and are negative       Objective:   Physical Exam  General:  NAD Eyes:  blind  Mouth:  pharynx pink and moist, no erythema, and no exudates.   Neck:  supple and no cervical lymphadenopathy.   Lungs:  Normal respiratory effort, chest expands symmetrically. Lungs are clear to auscultation, no crackles or wheezes. Heart:  Normal rate and regular rhythm. S1 and S2 normal without gallop, murmur, click, rub or other extra sounds. Abdomen:  soft, non-tender, and normal bowel sounds.   Extremities:  no edema    Assessment & Plan:

## 2010-04-06 NOTE — Assessment & Plan Note (Signed)
Continue aspirin for secondary prophylaxis.

## 2010-04-06 NOTE — Patient Instructions (Signed)
Make an appointment in 6 months.  Continue taking all medication as directed.

## 2010-04-06 NOTE — Assessment & Plan Note (Signed)
Check magnesium level today. 

## 2010-04-06 NOTE — Assessment & Plan Note (Signed)
Check bmet today   

## 2010-04-07 NOTE — Assessment & Plan Note (Signed)
At goal. Continue current regimen. 

## 2010-05-25 ENCOUNTER — Encounter: Payer: Self-pay | Admitting: Internal Medicine

## 2010-05-29 NOTE — Discharge Summary (Signed)
NAME:  KEE, DRUDGE NO.:  [id number]   MEDICAL RECORD NO.:  [id number]                   PATIENT TYPE:  INP   LOCATION:  3702                                 FACILITY:  [facility]   PHYSICIAN:  Foye Clock, MD                   DATE OF BIRTH:  [birth year]   DATE OF ADMISSION:  06/05/2003  DATE OF DISCHARGE:  06/07/2003                                 DISCHARGE SUMMARY   CONTINUATION   DISCHARGE MEDICATIONS:  1. Toprol XL 100 mg p.o. daily.  2. Norvasc 10 mg p.o. daily.  3. Maxzide 25/375 p.o. daily.  4. Quinapril 40 mg p.o. daily.  5. Ampicillin 250 mg 1 p.o. q.6h. x 7 days.   PROCEDURES:  1. Renal ultrasound negative for any hydronephrosis.  2. Abdominal ultrasound showed right inguinal hernia with bowel __________     testicles in a caudal direction, variceal to the left.  Right thigh could     not adequately be evaluated for variceal.  3. Two-view chest x-ray.   CONSULTATIONS:  None.   ADMISSION HISTORY AND PHYSICAL:  Please see chart for full details, but in  brief, Mr. Simmers is a 70 year old African-American male with history of  hypertension, CVA, chronic renal insufficiency, blindness, brought today via  EMS requesting assistance for episodes of urinary incontinence, nausea, and  vomiting that started three days prior to admission.  He denies any fevers,  abdominal pain, no flank pain.,   PHYSICAL EXAMINATION:  VITAL SIGNS: 100.7, pulse 136, respiratory rate 20,  blood pressure 133/83,saturation 96% on room air.  GENERAL:  Lying in bed.  HEENT:  Eyes rolled up.  No pupils to test.  Poor dentition, severe caries,  and periodontics disease, missing teeth.  NECK:  No JVD, no carotid bruits.  RESPIRATORY:  Clear to auscultation.  No crackles, wheezes, or rhonchi.  CARDIOVASCULAR:  Tachycardic.  No murmurs, rubs, or gallops.  ABDOMEN:  Soft, nontender, nondistended.  Positive bowel sounds.  Negative  Murphy's.  No hepatomegaly.  Left  side pedunculated mass.  EXTREMITIES:  No clubbing.  SKIN:  Warm and dry.  No rashes, no lesions.  LYMPH:  No lymphadenopathy.  MUSCULOSKELETAL:  Intact.  NEUROLOGIC:  Unable to test ocular.  Left side 4/5, right side 5/5. Psych  alert and oriented to time and place.   ADMISSION LABORATORY AND X-RAY DATA:  Sodium 132, potassium 2.7, chloride  97, CO2 26, BUN 16, creatinine 1.8, glucose 189.  White blood cell count  17.5, hemoglobin 13.1, ANC 15.3.  UA shows positive leukocyte esterase,  positive protein, white blood cells 7 to 10, red blood cells 3 to 6.   EKG shows sinus tachycardia, left axis deviation, T waves in inferior leads.   HOSPITAL COURSE:  #1.  UROSEPSIS:  This was most likely the etiology of the patient's urinary  incontinence plus nausea and vomiting.  Urinalysis helped to confirm  this.  Urine culture was sent and found to be positive for E. coli.  The patient  was initially treated empirically with Zosyn.  This was narrowed once  sensitivities arrived, and it was felt the patient could be continued on  ampicillin for his treatment for a total of 7 days.  Because the patient is  a man, the question is why he had the urinary tract infection.  The patient  was sent for renal ultrasound which has not shown any hydronephrosis.  Rectal evaluation noted to have prominent prostate gland.  It was thought  perhaps he was having some urinary retention from enlarged prostate.  If  this continues to happen, the patient may benefit from genitourinary consult  and therapy with Flomax.  Will defer to primary care physician for chronic  treatment of this.   #2.  ELEVATED GLUCOSE:  This was felt to be secondary to infection.  Checked  a hemoglobin A1C which was found to be 5.4 and, therefore, not likely to  have diabetes.  Plan is to continue to watch his glucose levels on  outpatient basis to see if any diabetes diagnosis is found.   #3.  HYPOKALEMIA:  This is felt to be secondary to  the nausea and vomiting.  The patient's potassium was repleted without any further incidents of  hypokalemia during hospitalization.   #4.  HYPOTENSION:  The patient was admitted with blood pressure of 133/83  which subsequently decreased down to 113/86 and 109/73.  The patient's  hypertensives were held over this hospitalization, but as he improved, the  patient was instructed to resume his antihypertensive medications once  discharged.   FOLLOW UP:  The patient is to follow up in the [facility]  on June 21, 2003, at 9:15 a.m. for evaluation and to make sure that all his  symptoms have resolved.                                                Foye Clock, MD    JH/MEDQ  D:  06/09/2003  T:  06/09/2003  Job:  [id number]

## 2010-05-29 NOTE — Discharge Summary (Signed)
NAME:  GRESHAM, CAETANO NO.:  [id number]   MEDICAL RECORD NO.:  [id number]                   PATIENT TYPE:  INP   LOCATION:  3702                                 FACILITY:  [facility]   PHYSICIAN:  Fransisco Hertz, M.D.               DATE OF BIRTH:  22-Dec-1940   DATE OF ADMISSION:  06/05/2003  DATE OF DISCHARGE:  06/07/2003                                 DISCHARGE SUMMARY   DISCHARGE DIAGNOSES:  1. Urosepsis secondary to Escherichia coli.  2. Hypotension secondary to #1.  3. Blindness secondary go glaucoma, chronic.  4. Chronic renal insufficiency.  5. Recurrent cerebrovascular accidents.   DICTATION ENDED HERE.      Foye Clock, MD                         Fransisco Hertz, M.D.    JH/MEDQ  D:  06/09/2003  T:  06/09/2003  Job:  [id number]

## 2010-09-15 ENCOUNTER — Other Ambulatory Visit: Payer: Self-pay | Admitting: *Deleted

## 2010-09-15 MED ORDER — METOPROLOL TARTRATE 100 MG PO TABS: 100.0000 mg | ORAL_TABLET | Freq: Every day | ORAL | Status: DC

## 2010-09-15 MED ORDER — QUINAPRIL HCL 40 MG PO TABS: 40.0000 mg | ORAL_TABLET | Freq: Every day | ORAL | Status: DC

## 2010-09-15 MED ORDER — AMLODIPINE BESYLATE 10 MG PO TABS: 10.0000 mg | ORAL_TABLET | Freq: Every day | ORAL | Status: DC

## 2010-09-15 MED ORDER — TRIAMTERENE-HCTZ 37.5-25 MG PO TABS: 1.0000 | ORAL_TABLET | Freq: Every day | ORAL | Status: DC

## 2010-09-15 NOTE — Telephone Encounter (Signed)
Please contact patient and schedule a follow-up appointment within 1 month.

## 2010-09-15 NOTE — Telephone Encounter (Signed)
Last labs 3/12

## 2010-09-15 NOTE — Telephone Encounter (Signed)
Message sent to front desk for an appt. 

## 2010-09-17 ENCOUNTER — Telehealth: Payer: Self-pay | Admitting: *Deleted

## 2010-09-17 MED ORDER — METOPROLOL SUCCINATE ER 100 MG PO TB24: 100.0000 mg | ORAL_TABLET | Freq: Every day | ORAL | Status: DC

## 2010-09-17 NOTE — Telephone Encounter (Signed)
Pharmacy called and informed about the change in Toprol. Message also left with family.

## 2010-09-17 NOTE — Telephone Encounter (Signed)
Received call from [place], sister of pt. She just received refill on metoprolol 100 mg and she states he has always taken metoprolol XL 100 mg. I looked in centricity and she is correct.  Will you make the change?  Refill was done 9/4

## 2010-09-17 NOTE — Telephone Encounter (Signed)
I changed the prescription to the XL form.  Please have patient take this and not the regular form, and notify the pharmacy as well.

## 2010-10-06 ENCOUNTER — Encounter: Payer: PRIVATE HEALTH INSURANCE | Admitting: Internal Medicine

## 2010-10-10 ENCOUNTER — Other Ambulatory Visit: Payer: Self-pay | Admitting: Internal Medicine

## 2010-10-12 ENCOUNTER — Other Ambulatory Visit: Payer: Self-pay | Admitting: *Deleted

## 2010-10-12 NOTE — Telephone Encounter (Signed)
Reviewed meds 

## 2010-10-15 ENCOUNTER — Ambulatory Visit (INDEPENDENT_AMBULATORY_CARE_PROVIDER_SITE_OTHER): Payer: PRIVATE HEALTH INSURANCE | Admitting: Internal Medicine

## 2010-10-15 ENCOUNTER — Encounter: Payer: Self-pay | Admitting: Internal Medicine

## 2010-10-15 VITALS — BP 124/89 | HR 86 | Temp 97.0°F | Ht 71.0 in | Wt 176.6 lb

## 2010-10-15 DIAGNOSIS — I498 Other specified cardiac arrhythmias: Secondary | ICD-10-CM

## 2010-10-15 DIAGNOSIS — Z299 Encounter for prophylactic measures, unspecified: Secondary | ICD-10-CM

## 2010-10-15 DIAGNOSIS — I10 Essential (primary) hypertension: Secondary | ICD-10-CM

## 2010-10-15 DIAGNOSIS — Z862 Personal history of diseases of the blood and blood-forming organs and certain disorders involving the immune mechanism: Secondary | ICD-10-CM

## 2010-10-15 DIAGNOSIS — Z8639 Personal history of other endocrine, nutritional and metabolic disease: Secondary | ICD-10-CM

## 2010-10-15 DIAGNOSIS — R066 Hiccough: Secondary | ICD-10-CM

## 2010-10-15 DIAGNOSIS — E785 Hyperlipidemia, unspecified: Secondary | ICD-10-CM

## 2010-10-15 DIAGNOSIS — I6350 Cerebral infarction due to unspecified occlusion or stenosis of unspecified cerebral artery: Secondary | ICD-10-CM

## 2010-10-15 DIAGNOSIS — N259 Disorder resulting from impaired renal tubular function, unspecified: Secondary | ICD-10-CM

## 2010-10-15 DIAGNOSIS — Z0000 Encounter for general adult medical examination without abnormal findings: Secondary | ICD-10-CM

## 2010-10-15 DIAGNOSIS — Z23 Encounter for immunization: Secondary | ICD-10-CM

## 2010-10-15 LAB — LIPID PANEL
Cholesterol: 155 mg/dL (ref 0–200)
HDL: 44 mg/dL (ref >39)
LDL Cholesterol: 82 mg/dL (ref 0–99)
Total CHOL/HDL Ratio: 3.5 Ratio
Triglycerides: 147 mg/dL (ref <150)
VLDL: 29 mg/dL (ref 0–40)

## 2010-10-15 LAB — COMPREHENSIVE METABOLIC PANEL
ALT: 15 U/L (ref 0–53)
AST: 21 U/L (ref 0–37)
Albumin: 4.5 g/dL (ref 3.5–5.2)
Alkaline Phosphatase: 65 U/L (ref 39–117)
BUN: 20 mg/dL (ref 6–23)
CO2: 25 mEq/L (ref 19–32)
Calcium: 10.2 mg/dL (ref 8.4–10.5)
Chloride: 100 mEq/L (ref 96–112)
Creat: 1.56 mg/dL — ABNORMAL HIGH (ref 0.50–1.35)
Glucose, Bld: 99 mg/dL (ref 70–99)
Potassium: 3.4 mEq/L — ABNORMAL LOW (ref 3.5–5.3)
Sodium: 137 mEq/L (ref 135–145)
Total Bilirubin: 0.6 mg/dL (ref 0.3–1.2)
Total Protein: 8 g/dL (ref 6.0–8.3)

## 2010-10-15 LAB — MAGNESIUM: Magnesium: 2.2 mg/dL (ref 1.5–2.5)

## 2010-10-15 LAB — CBC
HCT: 42.5 % (ref 39.0–52.0)
Hemoglobin: 14.9 g/dL (ref 13.0–17.0)
MCH: 29.6 pg (ref 26.0–34.0)
MCHC: 35.1 g/dL (ref 30.0–36.0)
MCV: 84.5 fL (ref 78.0–100.0)
Platelets: 286 10*3/uL (ref 150–400)
RBC: 5.03 MIL/uL (ref 4.22–5.81)
RDW: 14.1 % (ref 11.5–15.5)
WBC: 9.4 10*3/uL (ref 4.0–10.5)

## 2010-10-15 MED ORDER — METOPROLOL SUCCINATE ER 100 MG PO TB24: 100.0000 mg | ORAL_TABLET | Freq: Every day | ORAL | Status: DC

## 2010-10-15 MED ORDER — QUINAPRIL HCL 40 MG PO TABS: 40.0000 mg | ORAL_TABLET | Freq: Every day | ORAL | Status: DC

## 2010-10-15 MED ORDER — TRIAMTERENE-HCTZ 37.5-25 MG PO TABS: 1.0000 | ORAL_TABLET | Freq: Every day | ORAL | Status: DC

## 2010-10-15 MED ORDER — CHLORPROMAZINE HCL 25 MG PO TABS: 25.0000 mg | ORAL_TABLET | Freq: Three times a day (TID) | ORAL | Status: DC

## 2010-10-15 MED ORDER — AMLODIPINE BESYLATE 10 MG PO TABS: 10.0000 mg | ORAL_TABLET | Freq: Every day | ORAL | Status: DC

## 2010-10-15 MED ORDER — ASPIRIN EC 81 MG PO TBEC: 81.0000 mg | DELAYED_RELEASE_TABLET | Freq: Every day | ORAL | Status: AC

## 2010-10-15 NOTE — Progress Notes (Deleted)
  Subjective:    Patient ID: Austin Baker, male    DOB: 1940-04-30, 70 y.o.   MRN: [id number]  HPI    Review of Systems     Objective:   Physical Exam        Assessment & Plan:

## 2010-10-15 NOTE — Assessment & Plan Note (Signed)
Blood pressure well controlled on current regimen.   BP Readings from Last 3 Encounters:  10/15/10 124/89  04/06/10 136/74  08/21/09 118/74

## 2010-10-15 NOTE — Progress Notes (Signed)
  Subjective:   Patient ID: Austin Baker male   DOB: 02-06-1940 70 y.o.   MRN: [id number]  HPI: [name] is a 70 y.o. male with PMH significant as outlined below who presented to the clinic for a regular office visit. Patient noted that he is doing fine. Has no complains. Had no sever sickness in the last 6 month. No hospitalization.     Past Medical History  Diagnosis Date  . Hiccups   . BPH (benign prostatic hypertrophy)   . Hypomagnesemia   . Hypokalemia   . CVA (cerebral infarction)   . Hypertension   . Glaucoma   . Urosepsis     history of  . Renal insufficiency     Baseline Cr 1.5  . 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 amLODipine (NORVASC) 10 MG tablet Take 1 tablet (10 mg total) by mouth daily.  31 tablet  6  . chlorproMAZINE (THORAZINE) 25 MG tablet Take 1 tablet (25 mg total) by mouth 3 (three) times daily.  90 tablet  3  . metoprolol (TOPROL XL) 100 MG 24 hr tablet Take 1 tablet (100 mg total) by mouth daily.  30 tablet  6  . quinapril (ACCUPRIL) 40 MG tablet Take 1 tablet (40 mg total) by mouth daily.  31 tablet  6  . triamterene-hydrochlorothiazide (MAXZIDE-25) 37.5-25 MG per tablet Take 1 each (1 tablet total) by mouth daily.  31 tablet  6  . aspirin EC 81 MG tablet Take 1 tablet (81 mg total) by mouth daily.  30 tablet  2  Review of Systems: Constitutional: Denies fever, chills, diaphoresis, appetite change and fatigue.  HEENT: Denies  hearing loss, ear pain, congestion, sore throat, rhinorrhea, sneezing, mouth sores, trouble swallowing, neck pain, neck stiffness and tinnitus.   Respiratory: Denies SOB, DOE, cough, chest tightness,  and wheezing.   Cardiovascular: Denies chest pain, palpitations and leg swelling.  Gastrointestinal: Denies nausea, vomiting, abdominal pain, diarrhea, constipation, blood in stool and abdominal distention.  Genitourinary: Denies dysuria, urgency,  frequency, hematuria, flank pain and difficulty urinating.  Musculoskeletal: Denies myalgias, back pain, joint swelling, arthralgias and gait problem.  Skin: Denies pallor, rash and wound.  Neurological: Denies dizziness, seizures, syncope, weakness, light-headedness, numbness and headaches.   Objective:  Physical Exam: Filed Vitals:   10/15/10 1443  BP: 124/89  Pulse: 86  Temp: 97 F (36.1 C)  TempSrc: Oral  Height: 5\' 11"  (1.803 m)  Weight: 176 lb 9.6 oz (80.105 kg)   Constitutional: Vital signs reviewed.  Patient is a well-developed and well-nourished  in no acute distress and cooperative with exam. Alert and oriented x3.  Ear: TM normal bilaterally Mouth: no erythema or exudates, MMM Eyes: PERRL, EOMI, conjunctivae normal, No scleral icterus.  Neck: Supple Cardiovascular: RRR, S1 normal, S2 normal, no MRG, pulses symmetric and intact bilaterally Pulmonary/Chest: CTAB, no wheezes, rales, or rhonchi Abdominal: Soft. Non-tender, non-distended, bowel sounds are normal, no masses, organomegaly, or guarding present.  GU: no CVA tenderness Musculoskeletal: No joint deformities, erythema, or stiffness,  and no nontender Neurological: A&O x3, no focal motor deficit, sensory intact to light touch bilaterally.  Skin: Warm, dry and intact. No rash, cyanosis, or clubbing.  Psychiatric: Normal mood and affect. speech and behavior is normal.

## 2010-10-15 NOTE — Assessment & Plan Note (Addendum)
Will recheck bmet Update 10/4: K 3.4. Will repleat. Patient called and informed about the changes.

## 2010-10-15 NOTE — Assessment & Plan Note (Signed)
Will recheck renal function.  

## 2010-10-15 NOTE — Assessment & Plan Note (Signed)
Well controlled on Thorazine.

## 2010-10-15 NOTE — Assessment & Plan Note (Signed)
Flu vaccination given today  

## 2010-10-15 NOTE — Progress Notes (Deleted)
  Subjective:    Patient ID: Austin Baker, male    DOB: 05/11/1940, 70 y.o.   MRN: [id number]  HPI    Review of Systems     Objective:   Physical Exam        Assessment & Plan:   

## 2010-10-15 NOTE — Assessment & Plan Note (Signed)
Patient was not on Aspirin . Per sister due to stomach problem. In the setting of multiple CVA I recommended to restart the Aspirin.

## 2010-10-15 NOTE — Assessment & Plan Note (Signed)
Will check magnesium level today.

## 2010-10-16 ENCOUNTER — Other Ambulatory Visit: Payer: Self-pay | Admitting: Internal Medicine

## 2010-10-16 ENCOUNTER — Encounter: Payer: Self-pay | Admitting: Internal Medicine

## 2010-10-16 DIAGNOSIS — Z862 Personal history of diseases of the blood and blood-forming organs and certain disorders involving the immune mechanism: Secondary | ICD-10-CM

## 2010-10-16 MED ORDER — POTASSIUM CHLORIDE 20 MEQ PO PACK: 20.0000 meq | PACK | Freq: Every day | ORAL | Status: DC

## 2010-10-16 MED ORDER — POTASSIUM CHLORIDE 20 MEQ PO PACK: 20.0000 meq | PACK | Freq: Two times a day (BID) | ORAL | Status: DC

## 2010-10-16 NOTE — Progress Notes (Signed)
Addended by: Almyra Deforest on: 10/16/2010 01:31 PM   Modules accepted: Orders

## 2010-10-16 NOTE — Progress Notes (Signed)
Addended by: Almyra Deforest on: 10/16/2010 01:27 PM   Modules accepted: Orders

## 2010-10-29 ENCOUNTER — Other Ambulatory Visit: Payer: PRIVATE HEALTH INSURANCE

## 2010-10-29 DIAGNOSIS — Z8639 Personal history of other endocrine, nutritional and metabolic disease: Secondary | ICD-10-CM

## 2010-10-29 LAB — BASIC METABOLIC PANEL
BUN: 17 mg/dL (ref 6–23)
CO2: 26 mEq/L (ref 19–32)
Glucose, Bld: 107 mg/dL — ABNORMAL HIGH (ref 70–99)
Potassium: 3.6 mEq/L (ref 3.5–5.3)
Sodium: 135 mEq/L (ref 135–145)

## 2011-06-01 ENCOUNTER — Ambulatory Visit (INDEPENDENT_AMBULATORY_CARE_PROVIDER_SITE_OTHER): Payer: PRIVATE HEALTH INSURANCE | Admitting: Internal Medicine

## 2011-06-01 ENCOUNTER — Encounter: Payer: Self-pay | Admitting: Internal Medicine

## 2011-06-01 VITALS — BP 124/81 | HR 83 | Temp 99.8°F | Ht 71.0 in | Wt 181.7 lb

## 2011-06-01 DIAGNOSIS — R066 Hiccough: Secondary | ICD-10-CM

## 2011-06-01 DIAGNOSIS — Z862 Personal history of diseases of the blood and blood-forming organs and certain disorders involving the immune mechanism: Secondary | ICD-10-CM

## 2011-06-01 DIAGNOSIS — I498 Other specified cardiac arrhythmias: Secondary | ICD-10-CM

## 2011-06-01 DIAGNOSIS — N259 Disorder resulting from impaired renal tubular function, unspecified: Secondary | ICD-10-CM

## 2011-06-01 DIAGNOSIS — I10 Essential (primary) hypertension: Secondary | ICD-10-CM

## 2011-06-01 DIAGNOSIS — Z1211 Encounter for screening for malignant neoplasm of colon: Secondary | ICD-10-CM

## 2011-06-01 DIAGNOSIS — Z8639 Personal history of other endocrine, nutritional and metabolic disease: Secondary | ICD-10-CM

## 2011-06-01 DIAGNOSIS — Z0000 Encounter for general adult medical examination without abnormal findings: Secondary | ICD-10-CM

## 2011-06-01 DIAGNOSIS — I6350 Cerebral infarction due to unspecified occlusion or stenosis of unspecified cerebral artery: Secondary | ICD-10-CM

## 2011-06-01 DIAGNOSIS — K469 Unspecified abdominal hernia without obstruction or gangrene: Secondary | ICD-10-CM

## 2011-06-01 MED ORDER — TRIAMTERENE-HCTZ 37.5-25 MG PO TABS: 1.0000 | ORAL_TABLET | Freq: Every day | ORAL | Status: DC

## 2011-06-01 MED ORDER — QUINAPRIL HCL 40 MG PO TABS: 40.0000 mg | ORAL_TABLET | Freq: Every day | ORAL | Status: DC

## 2011-06-01 MED ORDER — CHLORPROMAZINE HCL 25 MG PO TABS: 25.0000 mg | ORAL_TABLET | Freq: Three times a day (TID) | ORAL | Status: DC

## 2011-06-01 MED ORDER — AMLODIPINE BESYLATE 10 MG PO TABS: 10.0000 mg | ORAL_TABLET | Freq: Every day | ORAL | Status: DC

## 2011-06-01 MED ORDER — METOPROLOL SUCCINATE ER 100 MG PO TB24: 100.0000 mg | ORAL_TABLET | Freq: Every day | ORAL | Status: DC

## 2011-06-01 NOTE — Assessment & Plan Note (Signed)
Per prior notes, pt had colonoscopy in 2006 with normal results, but cannot find actual test results.  Sister reports negative FOBT's since, last in 2009. - FOBT today, given to pt to take home - colonoscopy in 2016 or sooner if indicated

## 2011-06-01 NOTE — Assessment & Plan Note (Signed)
Large R inguinal hernia. Is chronic but slowly expanding over the years.  Pt has no pain but does feel weight in scrotum.  Needs eval by general surgery asap given size. - referral to general surgery

## 2011-06-01 NOTE — Progress Notes (Signed)
Subjective:     Patient ID: Austin Baker, male   DOB: 1940/02/25, 71 y.o.   MRN: [id number]  HPI Very pleasant 71 yo blind man with h/o HTN, CVA (last in 1996), chronic hernia who p/f f/u.  Last seen in 10/2010, no acute c/o at this time.  Has R inguinal hernia x many years, no pain, though is starting to feel fullness in scrotum.  Sister said he saw a Careers adviser many years ago in [place].  No CP, SOB, abd pain, dysuria.  Review of Systems As per HPI    Objective:   Physical Exam Gen: elderly man in NAD ABD: no TTP, soft, ND.  Large inguinal hernia present on R extending into scrotum.  Soft, no erythema or tendernes.    Assessment:         Plan:

## 2011-06-01 NOTE — Assessment & Plan Note (Signed)
Last bmet was wnl, will recheck today.  Not on k-dur at this time. - bmet today

## 2011-06-01 NOTE — Assessment & Plan Note (Signed)
Well controlled on multiple meds.  Sister helps give meds, pt reports good compliance.

## 2011-06-01 NOTE — Assessment & Plan Note (Addendum)
Last BMET was good, will con't routine monitoring today. - bmet today  Addendum: Cr of 1.58 is near baseline but on high side.  Should recheck bmet at next visit

## 2011-06-01 NOTE — Assessment & Plan Note (Signed)
Pt did not follow recs from last visit and did not restart ASA.  Sister manages meds, hesitant to start plavix.  Agrees to start ASA 81mg .

## 2011-06-02 LAB — BASIC METABOLIC PANEL
CO2: 25 mEq/L (ref 19–32)
Calcium: 10.4 mg/dL (ref 8.4–10.5)
Creat: 1.58 mg/dL — ABNORMAL HIGH (ref 0.50–1.35)
Glucose, Bld: 95 mg/dL (ref 70–99)
Sodium: 139 mEq/L (ref 135–145)

## 2011-06-21 ENCOUNTER — Ambulatory Visit (INDEPENDENT_AMBULATORY_CARE_PROVIDER_SITE_OTHER): Payer: Self-pay | Admitting: Surgery

## 2011-07-13 ENCOUNTER — Ambulatory Visit (INDEPENDENT_AMBULATORY_CARE_PROVIDER_SITE_OTHER): Payer: PRIVATE HEALTH INSURANCE | Admitting: Surgery

## 2011-07-13 ENCOUNTER — Encounter (INDEPENDENT_AMBULATORY_CARE_PROVIDER_SITE_OTHER): Payer: Self-pay | Admitting: Surgery

## 2011-07-13 VITALS — BP 156/100 | HR 84 | Ht 72.0 in | Wt 185.0 lb

## 2011-07-13 DIAGNOSIS — K4090 Unilateral inguinal hernia, without obstruction or gangrene, not specified as recurrent: Secondary | ICD-10-CM

## 2011-07-13 DIAGNOSIS — L918 Other hypertrophic disorders of the skin: Secondary | ICD-10-CM | POA: Insufficient documentation

## 2011-07-13 DIAGNOSIS — L919 Hypertrophic disorder of the skin, unspecified: Secondary | ICD-10-CM

## 2011-07-13 HISTORY — DX: Unilateral inguinal hernia, without obstruction or gangrene, not specified as recurrent: K40.90

## 2011-07-13 NOTE — Progress Notes (Signed)
Patient ID: Austin Baker, male   DOB: 05/31/1940, 71 y.o.   MRN: [id number]  Chief Complaint  Patient presents with  . Pre-op Exam    eval ing hernia    HPI Austin Baker is a 71 y.o. male.  Referred by Dr. Abner Greenspan for evaluation of massive right inguinal hernia HPI This is a 71 yo male who is blind that presents with a long-standing right inguinal hernia.  Over the last several months, this hernia has reportedly become much larger in size.  He denies any obstructive symptoms, but does report some discomfort in his groin.  He is now referred for surgical evaluation.  He also has a large protruding skin tag on his left hip.  Past Medical History  Diagnosis Date  . Hiccups   . BPH (benign prostatic hypertrophy)   . Hypomagnesemia   . Hypokalemia   . CVA (cerebral infarction)   . Hypertension   . Glaucoma   . Urosepsis     history of  . Renal insufficiency     Baseline Cr 1.5  . 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 HYPOMAGNESEMIA 03/31/2006  . Stroke     Past Surgical History  Procedure Date  . Glaucoma surgery     Family History  Problem Relation Age of Onset  . Diabetes Mother   . Hypertension Mother     Social History History  Substance Use Topics  . Smoking status: Former Smoker    Types: Cigarettes    Quit date: 01/11/1970  . Smokeless tobacco: Not on file  . Alcohol Use: No    No Known Allergies  Current Outpatient Prescriptions  Medication Sig Dispense Refill  . amLODipine (NORVASC) 10 MG tablet Take 1 tablet (10 mg total) by mouth daily.  31 tablet  6  . aspirin EC 81 MG tablet Take 1 tablet (81 mg total) by mouth daily.  30 tablet  2  . chlorproMAZINE (THORAZINE) 25 MG tablet Take 1 tablet (25 mg total) by mouth 3 (three) times daily.  90 tablet  3  . metoprolol succinate (TOPROL XL) 100 MG 24 hr tablet Take 1 tablet (100 mg total) by mouth daily.  30 tablet  6  . quinapril (ACCUPRIL) 40 MG tablet Take 1 tablet (40 mg total) by  mouth daily.  31 tablet  6  . triamterene-hydrochlorothiazide (MAXZIDE-25) 37.5-25 MG per tablet Take 1 each (1 tablet total) by mouth daily.  31 tablet  6    Review of Systems Review of Systems  Constitutional: Negative for fever, chills and unexpected weight change.  HENT: Negative for hearing loss, congestion, sore throat, trouble swallowing and voice change.   Eyes: Positive for visual disturbance.  Respiratory: Negative for cough and wheezing.   Cardiovascular: Negative for chest pain, palpitations and leg swelling.  Gastrointestinal: Negative for nausea, vomiting, abdominal pain, diarrhea, constipation, blood in stool, abdominal distention, anal bleeding and rectal pain.  Genitourinary: Negative for hematuria and difficulty urinating.  Musculoskeletal: Negative for arthralgias.  Skin: Negative for rash and wound.  Neurological: Negative for seizures, syncope, weakness and headaches.  Hematological: Negative for adenopathy. Does not bruise/bleed easily.  Psychiatric/Behavioral: Negative for confusion.    Blood pressure 156/100, pulse 84, height 6' (1.829 m), weight 185 lb (83.915 kg), SpO2 98.00%.  Physical Exam Physical Exam WDWN in NAD HEENT:  Blind; Neck:  No masses, no thyromegaly Lungs:  CTA bilaterally; normal respiratory effort CV:  Regular rate and rhythm; no murmurs Abd:  +  bowel sounds, soft, non-tender, no masses GU;  Massively enlarged right groin and scrotum; palpable testes bilaterally; no sign of left inguinal hernia, although difficult to examine due to the large right inguinal hernia Ext:  Well-perfused; no edema Skin:  Warm, dry; no sign of jaundice; left hip - pedunculated 4 cm fibroepithelial polyp  Data Reviewed none  Assessment    Right inguinal hernia Large left hip skin tag    Plan    Right inguinal hernia repair with mesh and excision of left hip skin tag.  Repair of the hernia will be more difficult than usual due to the massive size of the  hernia.  The surgical procedure has been discussed with the patient.  Potential risks, benefits, alternative treatments, and expected outcomes have been explained.  All of the patient's questions at this time have been answered.  The likelihood of reaching the patient's treatment goal is good.  The patient understand the proposed surgical procedure and wishes to proceed.        Klarisa Barman K. 07/13/2011, 3:52 PM

## 2011-07-14 ENCOUNTER — Other Ambulatory Visit (INDEPENDENT_AMBULATORY_CARE_PROVIDER_SITE_OTHER): Payer: Self-pay | Admitting: Surgery

## 2011-07-14 NOTE — Progress Notes (Signed)
Addendum:  The patient wishes to only have the hernia repaired, but does not want the skin tag removed.  The orders have been changed to reflect his wishes.  Wilmon Arms. Corliss Skains, MD, [facility]  07/14/2011 8:16 AM

## 2011-08-04 ENCOUNTER — Encounter (HOSPITAL_COMMUNITY): Payer: Self-pay | Admitting: Pharmacy Technician

## 2011-08-10 ENCOUNTER — Encounter (HOSPITAL_COMMUNITY)
Admission: RE | Admit: 2011-08-10 | Discharge: 2011-08-10 | Disposition: A | Discharge status: Home / Self Care | Payer: PRIVATE HEALTH INSURANCE | Source: Ambulatory Visit | Attending: Surgery | Admitting: Surgery

## 2011-08-10 ENCOUNTER — Encounter (HOSPITAL_COMMUNITY): Payer: Self-pay

## 2011-08-10 HISTORY — DX: Urinary tract infection, site not specified: N39.0

## 2011-08-10 LAB — SURGICAL PCR SCREEN
MRSA, PCR: NEGATIVE
Staphylococcus aureus: POSITIVE — AB

## 2011-08-10 LAB — BASIC METABOLIC PANEL
Chloride: 100 mEq/L (ref 96–112)
GFR calc Af Amer: 59 mL/min — ABNORMAL LOW (ref >90)
GFR calc non Af Amer: 51 mL/min — ABNORMAL LOW (ref >90)
Glucose, Bld: 104 mg/dL — ABNORMAL HIGH (ref 70–99)
Potassium: 3.2 mEq/L — ABNORMAL LOW (ref 3.5–5.1)
Sodium: 137 mEq/L (ref 135–145)

## 2011-08-10 LAB — CBC
HCT: 42.1 % (ref 39.0–52.0)
Hemoglobin: 15.3 g/dL (ref 13.0–17.0)
RBC: 5.11 MIL/uL (ref 4.22–5.81)

## 2011-08-10 NOTE — Pre-Procedure Instructions (Signed)
20 Austin Baker  08/10/2011   Your procedure is scheduled on:  Thursday August 19, 2011  Report to [facility] at 5:30 AM.  Call this number if you have problems the morning of surgery: [phone]   Remember:   Do not eat food or drink after midnight    Take these medicines the morning of surgery with A SIP OF WATER: amlodipine, metoprolol, thorazine   Do not wear jewelry, make-up or nail polish.  Do not wear lotions, powders, or perfumes. You may wear deodorant.  Do not shave 48 hours prior to surgery. Men may shave face and neck.  Do not bring valuables to the hospital.  Contacts, dentures or bridgework may not be worn into surgery.  Leave suitcase in the car. After surgery it may be brought to your room.  For patients admitted to the hospital, checkout time is 11:00 AM the day of discharge.   Patients discharged the day of surgery will not be allowed to drive home.  Name and phone number of your driver:  Family / friend  Special Instructions: CHG Shower Use Special Wash: 1/2 bottle night before surgery and 1/2 bottle morning of surgery.   Please read over the following fact sheets that you were given: Pain Booklet, Coughing and Deep Breathing, MRSA Information and Surgical Site Infection Prevention

## 2011-08-10 NOTE — Progress Notes (Signed)
Forwarded EKG to [facility] for review.

## 2011-08-10 NOTE — Progress Notes (Signed)
Quick Note:  This patient may proceed with surgery.  Please call in KCl 40 mEq one tab PO bid #18 no refills Repeat K level on morning of surgery ______

## 2011-08-11 NOTE — Consult Note (Signed)
Anesthesia Chart Review:  Patient is a 71 year old male scheduled for right IHR on 08/19/11.  Dr.Tsuei describes the hernia as "massive".  His note also mentions excising a left hip skin tag at the time of surgery.    History includes former smoker, HTN, CVA '96, CRI, glaucoma, hiccups, urosepsis. Patient is blind per Dr. Fatima Sanger office note.  PCP is Dr. Abner Greenspan at [facility].    EKG on 08/10/11 showed NSR, LAFB, baseline artifact.  Rate is slower and artifact is new since 06/05/03.  His last echo was on 06/06/03 and showed normal LVEF and no significant valvular abnormalities.  CXR on 08/10/11 showed no acute cardiopulmonary disease.  Labs noted.  K 3.2,  BUN/Cr 19/1.37, glucose 104.  WBC 10.6, H/H 15.3/42.1, PLT 232.  Anticipate he can proceed as planned.  Shonna Chock, PA-C

## 2011-08-12 ENCOUNTER — Inpatient Hospital Stay (HOSPITAL_COMMUNITY): Admission: RE | Admit: 2011-08-12 | Payer: PRIVATE HEALTH INSURANCE | Source: Ambulatory Visit

## 2011-08-13 NOTE — Progress Notes (Signed)
L.M FOR PT TO CALL AND ASK FOR ME  IF HE RETURNS HOME  BEFORE 2PM TODAY..he NEEDS TO BE ADVISED OF POSITIVE PCR-POSITIVE FOR  SA.

## 2011-08-16 NOTE — Progress Notes (Signed)
Call to [phone] to advise of positive pcr(pos SA).  No answer l.m. To callback at [phone]

## 2011-08-18 MED ORDER — CEFAZOLIN SODIUM-DEXTROSE 2-3 GM-% IV SOLR
2.0000 g | INTRAVENOUS | Status: DC
  Filled 2011-08-18: qty 50

## 2011-08-19 ENCOUNTER — Encounter (HOSPITAL_COMMUNITY)
Admission: RE | Disposition: A | Discharge status: Home / Self Care | Payer: Self-pay | Source: Ambulatory Visit | Attending: Surgery

## 2011-08-19 ENCOUNTER — Ambulatory Visit (HOSPITAL_COMMUNITY)
Admission: RE | Admit: 2011-08-19 | Discharge: 2011-08-20 | Disposition: A | Discharge status: Home / Self Care | Payer: PRIVATE HEALTH INSURANCE | Source: Ambulatory Visit | Attending: Surgery | Admitting: Surgery

## 2011-08-19 ENCOUNTER — Encounter (HOSPITAL_COMMUNITY): Payer: Self-pay | Admitting: Vascular Surgery

## 2011-08-19 ENCOUNTER — Ambulatory Visit (HOSPITAL_COMMUNITY): Payer: PRIVATE HEALTH INSURANCE | Admitting: Vascular Surgery

## 2011-08-19 ENCOUNTER — Encounter (HOSPITAL_COMMUNITY): Payer: Self-pay | Admitting: *Deleted

## 2011-08-19 DIAGNOSIS — Z01812 Encounter for preprocedural laboratory examination: Secondary | ICD-10-CM | POA: Insufficient documentation

## 2011-08-19 DIAGNOSIS — K4030 Unilateral inguinal hernia, with obstruction, without gangrene, not specified as recurrent: Secondary | ICD-10-CM

## 2011-08-19 DIAGNOSIS — K4090 Unilateral inguinal hernia, without obstruction or gangrene, not specified as recurrent: Secondary | ICD-10-CM | POA: Insufficient documentation

## 2011-08-19 DIAGNOSIS — Z01818 Encounter for other preprocedural examination: Secondary | ICD-10-CM | POA: Insufficient documentation

## 2011-08-19 DIAGNOSIS — I498 Other specified cardiac arrhythmias: Secondary | ICD-10-CM

## 2011-08-19 DIAGNOSIS — Z01810 Encounter for preprocedural cardiovascular examination: Secondary | ICD-10-CM | POA: Insufficient documentation

## 2011-08-19 DIAGNOSIS — I10 Essential (primary) hypertension: Secondary | ICD-10-CM | POA: Insufficient documentation

## 2011-08-19 DIAGNOSIS — N289 Disorder of kidney and ureter, unspecified: Secondary | ICD-10-CM | POA: Insufficient documentation

## 2011-08-19 DIAGNOSIS — I6350 Cerebral infarction due to unspecified occlusion or stenosis of unspecified cerebral artery: Secondary | ICD-10-CM

## 2011-08-19 DIAGNOSIS — R066 Hiccough: Secondary | ICD-10-CM

## 2011-08-19 DIAGNOSIS — Z8673 Personal history of transient ischemic attack (TIA), and cerebral infarction without residual deficits: Secondary | ICD-10-CM | POA: Insufficient documentation

## 2011-08-19 HISTORY — PX: INGUINAL HERNIA REPAIR: SHX194

## 2011-08-19 LAB — CBC
HCT: 38 % — ABNORMAL LOW (ref 39.0–52.0)
MCHC: 35.8 g/dL (ref 30.0–36.0)
Platelets: 210 10*3/uL (ref 150–400)
RDW: 13.7 % (ref 11.5–15.5)
WBC: 16.5 10*3/uL — ABNORMAL HIGH (ref 4.0–10.5)

## 2011-08-19 LAB — CREATININE, SERUM
GFR calc Af Amer: 62 mL/min — ABNORMAL LOW (ref >90)
GFR calc non Af Amer: 53 mL/min — ABNORMAL LOW (ref >90)

## 2011-08-19 SURGERY — REPAIR, HERNIA, INGUINAL, ADULT
Anesthesia: General | Site: Groin | Laterality: Right | Wound class: Clean

## 2011-08-19 MED ORDER — NEOSTIGMINE METHYLSULFATE 1 MG/ML IJ SOLN
INTRAMUSCULAR | Status: DC | PRN
  Administered 2011-08-19: 4 mg via INTRAVENOUS

## 2011-08-19 MED ORDER — HYDROMORPHONE HCL PF 1 MG/ML IJ SOLN: 0.2500 mg | INTRAMUSCULAR | Status: DC | PRN

## 2011-08-19 MED ORDER — LACTATED RINGERS IV SOLN
INTRAVENOUS | Status: DC | PRN
  Administered 2011-08-19 (×3): via INTRAVENOUS

## 2011-08-19 MED ORDER — 0.9 % SODIUM CHLORIDE (POUR BTL) OPTIME
TOPICAL | Status: DC | PRN
  Administered 2011-08-19: 1000 mL

## 2011-08-19 MED ORDER — KCL IN DEXTROSE-NACL 20-5-0.45 MEQ/L-%-% IV SOLN
INTRAVENOUS | Status: DC
  Administered 2011-08-19: 13:00:00 via INTRAVENOUS
  Filled 2011-08-19 (×3): qty 1000

## 2011-08-19 MED ORDER — CHLORPROMAZINE HCL 25 MG PO TABS
25.0000 mg | ORAL_TABLET | Freq: Three times a day (TID) | ORAL | Status: DC
  Administered 2011-08-19 – 2011-08-20 (×3): 25 mg via ORAL
  Filled 2011-08-19 (×5): qty 1

## 2011-08-19 MED ORDER — QUINAPRIL HCL 10 MG PO TABS: 40.0000 mg | ORAL_TABLET | Freq: Every day | ORAL | Status: DC

## 2011-08-19 MED ORDER — MUPIROCIN 2 % EX OINT
TOPICAL_OINTMENT | Freq: Once | CUTANEOUS | Status: AC
  Administered 2011-08-19: 06:00:00 via NASAL

## 2011-08-19 MED ORDER — PROPOFOL 10 MG/ML IV EMUL
INTRAVENOUS | Status: DC | PRN
  Administered 2011-08-19: 100 mg via INTRAVENOUS

## 2011-08-19 MED ORDER — CEFAZOLIN SODIUM 1-5 GM-% IV SOLN
INTRAVENOUS | Status: DC | PRN
  Administered 2011-08-19: 2 g via INTRAVENOUS

## 2011-08-19 MED ORDER — ONDANSETRON HCL 4 MG PO TABS: 4.0000 mg | ORAL_TABLET | Freq: Four times a day (QID) | ORAL | Status: DC | PRN

## 2011-08-19 MED ORDER — METOPROLOL SUCCINATE ER 100 MG PO TB24
100.0000 mg | ORAL_TABLET | Freq: Every day | ORAL | Status: DC
  Administered 2011-08-20: 100 mg via ORAL
  Filled 2011-08-19: qty 1

## 2011-08-19 MED ORDER — LIDOCAINE HCL 4 % MT SOLN
OROMUCOSAL | Status: DC | PRN
  Administered 2011-08-19: 4 mL via TOPICAL

## 2011-08-19 MED ORDER — BUPIVACAINE-EPINEPHRINE 0.25% -1:200000 IJ SOLN
INTRAMUSCULAR | Status: DC | PRN
  Administered 2011-08-19: 1 mL

## 2011-08-19 MED ORDER — MORPHINE SULFATE 2 MG/ML IJ SOLN: 2.0000 mg | INTRAMUSCULAR | Status: DC | PRN

## 2011-08-19 MED ORDER — AMLODIPINE BESYLATE 10 MG PO TABS
10.0000 mg | ORAL_TABLET | Freq: Every day | ORAL | Status: DC
  Administered 2011-08-20: 10 mg via ORAL
  Filled 2011-08-19: qty 1

## 2011-08-19 MED ORDER — LISINOPRIL 40 MG PO TABS
40.0000 mg | ORAL_TABLET | Freq: Every day | ORAL | Status: DC
  Administered 2011-08-20: 40 mg via ORAL
  Filled 2011-08-19: qty 1

## 2011-08-19 MED ORDER — BUPIVACAINE-EPINEPHRINE PF 0.25-1:200000 % IJ SOLN
INTRAMUSCULAR | Status: AC
  Filled 2011-08-19: qty 30

## 2011-08-19 MED ORDER — LIDOCAINE HCL (CARDIAC) 20 MG/ML IV SOLN
INTRAVENOUS | Status: DC | PRN
  Administered 2011-08-19: 100 mg via INTRAVENOUS

## 2011-08-19 MED ORDER — ENOXAPARIN SODIUM 40 MG/0.4ML ~~LOC~~ SOLN
40.0000 mg | SUBCUTANEOUS | Status: DC
  Administered 2011-08-19: 40 mg via SUBCUTANEOUS
  Filled 2011-08-19 (×2): qty 0.4

## 2011-08-19 MED ORDER — ONDANSETRON HCL 4 MG/2ML IJ SOLN
4.0000 mg | Freq: Four times a day (QID) | INTRAMUSCULAR | Status: DC | PRN
  Administered 2011-08-19: 4 mg via INTRAVENOUS
  Filled 2011-08-19: qty 2

## 2011-08-19 MED ORDER — EPHEDRINE SULFATE 50 MG/ML IJ SOLN
INTRAMUSCULAR | Status: DC | PRN
  Administered 2011-08-19 (×4): 10 mg via INTRAVENOUS

## 2011-08-19 MED ORDER — KETOROLAC TROMETHAMINE 15 MG/ML IJ SOLN
15.0000 mg | Freq: Four times a day (QID) | INTRAMUSCULAR | Status: DC
  Administered 2011-08-19 – 2011-08-20 (×4): 15 mg via INTRAVENOUS
  Filled 2011-08-19 (×8): qty 1

## 2011-08-19 MED ORDER — ROCURONIUM BROMIDE 100 MG/10ML IV SOLN
INTRAVENOUS | Status: DC | PRN
  Administered 2011-08-19: 10 mg via INTRAVENOUS
  Administered 2011-08-19: 50 mg via INTRAVENOUS

## 2011-08-19 MED ORDER — CEFAZOLIN SODIUM 1-5 GM-% IV SOLN
1.0000 g | Freq: Four times a day (QID) | INTRAVENOUS | Status: AC
  Administered 2011-08-19 – 2011-08-20 (×3): 1 g via INTRAVENOUS
  Filled 2011-08-19 (×3): qty 50

## 2011-08-19 MED ORDER — OXYCODONE-ACETAMINOPHEN 5-325 MG PO TABS: 1.0000 | ORAL_TABLET | ORAL | Status: DC | PRN

## 2011-08-19 MED ORDER — GLYCOPYRROLATE 0.2 MG/ML IJ SOLN
INTRAMUSCULAR | Status: DC | PRN
  Administered 2011-08-19: .6 mg via INTRAVENOUS

## 2011-08-19 MED ORDER — SUFENTANIL CITRATE 50 MCG/ML IV SOLN
INTRAVENOUS | Status: DC | PRN
  Administered 2011-08-19: 30 ug via INTRAVENOUS

## 2011-08-19 MED ORDER — TRIAMTERENE-HCTZ 37.5-25 MG PO TABS
1.0000 | ORAL_TABLET | Freq: Every day | ORAL | Status: DC
  Administered 2011-08-20: 1 via ORAL
  Filled 2011-08-19: qty 1

## 2011-08-19 MED ORDER — ONDANSETRON HCL 4 MG/2ML IJ SOLN: 4.0000 mg | Freq: Once | INTRAMUSCULAR | Status: DC | PRN

## 2011-08-19 MED ORDER — ONDANSETRON HCL 4 MG/2ML IJ SOLN
INTRAMUSCULAR | Status: DC | PRN
  Administered 2011-08-19: 4 mg via INTRAVENOUS

## 2011-08-19 MED ORDER — PHENYLEPHRINE HCL 10 MG/ML IJ SOLN
INTRAMUSCULAR | Status: DC | PRN
  Administered 2011-08-19 (×3): 80 ug via INTRAVENOUS
  Administered 2011-08-19: 40 ug via INTRAVENOUS
  Administered 2011-08-19 (×6): 80 ug via INTRAVENOUS

## 2011-08-19 MED ORDER — MUPIROCIN 2 % EX OINT
TOPICAL_OINTMENT | CUTANEOUS | Status: AC
  Filled 2011-08-19: qty 22

## 2011-08-19 MED ORDER — PHENYLEPHRINE HCL 10 MG/ML IJ SOLN
10.0000 mg | INTRAMUSCULAR | Status: DC | PRN
  Administered 2011-08-19: 10 ug/min via INTRAVENOUS

## 2011-08-19 SURGICAL SUPPLY — 63 items
APL SKNCLS STERI-STRIP NONHPOA (GAUZE/BANDAGES/DRESSINGS) ×1
BENZOIN TINCTURE PRP APPL 2/3 (GAUZE/BANDAGES/DRESSINGS) ×2 IMPLANT
BLADE SURG 15 STRL LF DISP TIS (BLADE) ×1 IMPLANT
BLADE SURG 15 STRL SS (BLADE) ×2
BLADE SURG ROTATE 9660 (MISCELLANEOUS) ×2 IMPLANT
CHLORAPREP W/TINT 26ML (MISCELLANEOUS) ×2 IMPLANT
CLOTH BEACON ORANGE TIMEOUT ST (SAFETY) ×2 IMPLANT
COVER SURGICAL LIGHT HANDLE (MISCELLANEOUS) ×2 IMPLANT
DECANTER SPIKE VIAL GLASS SM (MISCELLANEOUS) IMPLANT
DRAIN PENROSE 1/2X12 LTX STRL (WOUND CARE) ×2 IMPLANT
DRAPE LAPAROSCOPIC ABDOMINAL (DRAPES) ×2 IMPLANT
DRAPE LAPAROTOMY TRNSV 102X78 (DRAPE) IMPLANT
DRAPE UTILITY 15X26 W/TAPE STR (DRAPE) ×4 IMPLANT
DRSG TEGADERM 4X4.75 (GAUZE/BANDAGES/DRESSINGS) ×4 IMPLANT
ELECT CAUTERY BLADE 6.4 (BLADE) ×2 IMPLANT
ELECT REM PT RETURN 9FT ADLT (ELECTROSURGICAL) ×2
ELECTRODE REM PT RTRN 9FT ADLT (ELECTROSURGICAL) ×1 IMPLANT
GAUZE SPONGE 4X4 16PLY XRAY LF (GAUZE/BANDAGES/DRESSINGS) ×2 IMPLANT
GLOVE BIO SURGEON STRL SZ7 (GLOVE) ×2 IMPLANT
GLOVE BIO SURGEON STRL SZ7.5 (GLOVE) ×2 IMPLANT
GLOVE BIOGEL PI IND STRL 6 (GLOVE) ×1 IMPLANT
GLOVE BIOGEL PI IND STRL 7.0 (GLOVE) ×1 IMPLANT
GLOVE BIOGEL PI IND STRL 7.5 (GLOVE) ×3 IMPLANT
GLOVE BIOGEL PI INDICATOR 6 (GLOVE) ×1
GLOVE BIOGEL PI INDICATOR 7.0 (GLOVE) ×1
GLOVE BIOGEL PI INDICATOR 7.5 (GLOVE) ×3
GLOVE SS BIOGEL STRL SZ 6.5 (GLOVE) ×1 IMPLANT
GLOVE SS BIOGEL STRL SZ 7 (GLOVE) ×1 IMPLANT
GLOVE SUPERSENSE BIOGEL SZ 6.5 (GLOVE) ×1
GLOVE SUPERSENSE BIOGEL SZ 7 (GLOVE) ×1
GOWN STRL NON-REIN LRG LVL3 (GOWN DISPOSABLE) ×8 IMPLANT
KIT BASIN OR (CUSTOM PROCEDURE TRAY) ×2 IMPLANT
KIT ROOM TURNOVER OR (KITS) ×2 IMPLANT
LIGASURE IMPACT 36 18CM CVD LR (INSTRUMENTS) ×2 IMPLANT
MESH ULTRAPRO 3X6 7.6X15CM (Mesh General) ×2 IMPLANT
NEEDLE HYPO 25GX1X1/2 BEV (NEEDLE) ×2 IMPLANT
NS IRRIG 1000ML POUR BTL (IV SOLUTION) ×2 IMPLANT
PACK SURGICAL SETUP 50X90 (CUSTOM PROCEDURE TRAY) ×2 IMPLANT
PAD ARMBOARD 7.5X6 YLW CONV (MISCELLANEOUS) ×2 IMPLANT
PENCIL BUTTON HOLSTER BLD 10FT (ELECTRODE) ×2 IMPLANT
SPECIMEN JAR SMALL (MISCELLANEOUS) IMPLANT
SPONGE GAUZE 4X4 12PLY (GAUZE/BANDAGES/DRESSINGS) ×2 IMPLANT
SPONGE INTESTINAL PEANUT (DISPOSABLE) ×2 IMPLANT
SPONGE LAP 18X18 X RAY DECT (DISPOSABLE) ×2 IMPLANT
STRIP CLOSURE SKIN 1/2X4 (GAUZE/BANDAGES/DRESSINGS) ×2 IMPLANT
SUT MNCRL AB 4-0 PS2 18 (SUTURE) ×2 IMPLANT
SUT PDS AB 0 CT 36 (SUTURE) IMPLANT
SUT PROLENE 2 0 SH DA (SUTURE) ×2 IMPLANT
SUT SILK 2 0 SH (SUTURE) ×4 IMPLANT
SUT SILK 3 0 (SUTURE) ×2
SUT SILK 3-0 18XBRD TIE 12 (SUTURE) ×1 IMPLANT
SUT VIC AB 0 CT1 27 (SUTURE) ×2
SUT VIC AB 0 CT1 27XBRD ANBCTR (SUTURE) ×1 IMPLANT
SUT VIC AB 0 CT2 27 (SUTURE) IMPLANT
SUT VIC AB 2-0 SH 27 (SUTURE) ×2
SUT VIC AB 2-0 SH 27X BRD (SUTURE) ×1 IMPLANT
SUT VIC AB 3-0 SH 27 (SUTURE) ×2
SUT VIC AB 3-0 SH 27XBRD (SUTURE) ×1 IMPLANT
SYR CONTROL 10ML LL (SYRINGE) ×2 IMPLANT
TOWEL OR 17X24 6PK STRL BLUE (TOWEL DISPOSABLE) ×2 IMPLANT
TOWEL OR 17X26 10 PK STRL BLUE (TOWEL DISPOSABLE) ×2 IMPLANT
TUBE CONNECTING 20X1/4 (TUBING) ×2 IMPLANT
YANKAUER SUCT BULB TIP NO VENT (SUCTIONS) ×2 IMPLANT

## 2011-08-19 NOTE — Anesthesia Postprocedure Evaluation (Signed)
Anesthesia Post Note  Patient: Austin Baker  Procedure(s) Performed: Procedure(s) (LRB): HERNIA REPAIR INGUINAL ADULT (Right) INSERTION OF MESH (Right)  Anesthesia type: general  Patient location: PACU  Post pain: Pain level controlled  Post assessment: Patient's Cardiovascular Status Stable  Last Vitals:  Filed Vitals:   08/19/11 0934  BP:   Pulse:   Temp: 36.6 C  Resp:     Post vital signs: Reviewed and stable  Level of consciousness: sedated  Complications: No apparent anesthesia complications

## 2011-08-19 NOTE — Anesthesia Procedure Notes (Signed)
Procedure Name: Intubation Date/Time: 08/19/2011 7:41 AM Performed by: Glendora Score A Pre-anesthesia Checklist: Patient identified, Emergency Drugs available, Suction available and Patient being monitored Patient Re-evaluated:Patient Re-evaluated prior to inductionOxygen Delivery Method: Circle system utilized Preoxygenation: Pre-oxygenation with 100% oxygen Intubation Type: IV induction Ventilation: Mask ventilation without difficulty and Oral airway inserted - appropriate to patient size Laryngoscope Size: Hyacinth Meeker and 2 Grade View: Grade I Tube type: Oral Tube size: 7.5 mm Number of attempts: 1 Airway Equipment and Method: Stylet and LTA kit utilized Placement Confirmation: ETT inserted through vocal cords under direct vision,  positive ETCO2 and breath sounds checked- equal and bilateral Secured at: 22 cm Tube secured with: Tape Dental Injury: Teeth and Oropharynx as per pre-operative assessment

## 2011-08-19 NOTE — H&P (Signed)
Progress Notes     Patient ID: Austin Baker, male   DOB: 11/12/1940, 71 y.o.   MRN: [id number]    Chief Complaint   Patient presents with   .  Pre-op Exam       eval ing hernia        HPI Austin Baker is a 71 y.o. male.  Referred by Dr. Abner Greenspan for evaluation of massive right inguinal hernia HPI This is a 71 yo male who is blind that presents with a long-standing right inguinal hernia.  Over the last several months, this hernia has reportedly become much larger in size.  He denies any obstructive symptoms, but does report some discomfort in his groin.  He is now referred for surgical evaluation.  He also has a large protruding skin tag on his left hip.    Past Medical History   Diagnosis  Date   .  Hiccups     .  BPH (benign prostatic hypertrophy)     .  Hypomagnesemia     .  Hypokalemia     .  CVA (cerebral infarction)     .  Hypertension     .  Glaucoma     .  Urosepsis         history of   .  Renal insufficiency         Baseline Cr 1.5   .  Lipoma         pedunculated on left belt line   .  HYPOMAGNESEMIA  03/31/2006   .  Stroke           Past Surgical History   Procedure  Date   .  Glaucoma surgery           Family History   Problem  Relation  Age of Onset   .  Diabetes  Mother     .  Hypertension  Mother          Social History History   Substance Use Topics   .  Smoking status:  Former Smoker       Types:  Cigarettes       Quit date:  01/11/1970   .  Smokeless tobacco:  Not on file   .  Alcohol Use:  No        No Known Allergies    Current Outpatient Prescriptions   Medication  Sig  Dispense  Refill   .  amLODipine (NORVASC) 10 MG tablet  Take 1 tablet (10 mg total) by mouth daily.   31 tablet   6   .  aspirin EC 81 MG tablet  Take 1 tablet (81 mg total) by mouth daily.   30 tablet   2   .  chlorproMAZINE (THORAZINE) 25 MG tablet  Take 1 tablet (25 mg total) by mouth 3 (three) times daily.   90 tablet   3   .  metoprolol  succinate (TOPROL XL) 100 MG 24 hr tablet  Take 1 tablet (100 mg total) by mouth daily.   30 tablet   6   .  quinapril (ACCUPRIL) 40 MG tablet  Take 1 tablet (40 mg total) by mouth daily.   31 tablet   6   .  triamterene-hydrochlorothiazide (MAXZIDE-25) 37.5-25 MG per tablet  Take 1 each (1 tablet total) by mouth daily.   31 tablet   6        Review of Systems Review of Systems  Constitutional: Negative for fever,  chills and unexpected weight change.  HENT: Negative for hearing loss, congestion, sore throat, trouble swallowing and voice change.   Eyes: Positive for visual disturbance.  Respiratory: Negative for cough and wheezing.   Cardiovascular: Negative for chest pain, palpitations and leg swelling.  Gastrointestinal: Negative for nausea, vomiting, abdominal pain, diarrhea, constipation, blood in stool, abdominal distention, anal bleeding and rectal pain.  Genitourinary: Negative for hematuria and difficulty urinating.  Musculoskeletal: Negative for arthralgias.  Skin: Negative for rash and wound.  Neurological: Negative for seizures, syncope, weakness and headaches.  Hematological: Negative for adenopathy. Does not bruise/bleed easily.  Psychiatric/Behavioral: Negative for confusion.      Blood pressure 156/100, pulse 84, height 6' (1.829 m), weight 185 lb (83.915 kg), SpO2 98.00%.   Physical Exam Physical Exam WDWN in NAD HEENT:  Blind; Neck:  No masses, no thyromegaly Lungs:  CTA bilaterally; normal respiratory effort CV:  Regular rate and rhythm; no murmurs Abd:  +bowel sounds, soft, non-tender, no masses GU;  Massively enlarged right groin and scrotum; palpable testes bilaterally; no sign of left inguinal hernia, although difficult to examine due to the large right inguinal hernia Ext:  Well-perfused; no edema Skin:  Warm, dry; no sign of jaundice; left hip - pedunculated 4 cm fibroepithelial polyp   Data Reviewed none   Assessment    Right inguinal  hernia Large left hip skin tag - patient declines removal at this time.     Plan    Right inguinal hernia repair with mesh.  Repair of the hernia will be more difficult than usual due to the massive size of the hernia.  Risk of recurrence is also higher than usual due to its size. The surgical procedure has been discussed with the patient.  Potential risks, benefits, alternative treatments, and expected outcomes have been explained.  All of the patient's questions at this time have been answered.  The likelihood of reaching the patient's treatment goal is good.  The patient understand the proposed surgical procedure and wishes to proceed.       Wilmon Arms. Corliss Skains, MD, [facility]  08/19/2011 7:26 AM

## 2011-08-19 NOTE — Anesthesia Preprocedure Evaluation (Addendum)
Anesthesia Evaluation  Patient identified by MRN, date of birth, ID band Patient awake    Reviewed: Allergy & Precautions, H&P , NPO status , Patient's Chart, lab work & pertinent test results, reviewed documented beta blocker date and time   Airway Mallampati: I TM Distance: >3 FB Neck ROM: Full    Dental  (+) Missing   Pulmonary          Cardiovascular hypertension, Pt. on medications and Pt. on home beta blockers     Neuro/Psych CVA    GI/Hepatic Inguinal hernia   Endo/Other    Renal/GU Renal InsufficiencyRenal disease     Musculoskeletal   Abdominal   Peds  Hematology   Anesthesia Other Findings blindness  Reproductive/Obstetrics                         Anesthesia Physical Anesthesia Plan  ASA: III  Anesthesia Plan: General   Post-op Pain Management:    Induction:   Airway Management Planned: LMA  Additional Equipment:   Intra-op Plan:   Post-operative Plan: Extubation in OR  Informed Consent: I have reviewed the patients History and Physical, chart, labs and discussed the procedure including the risks, benefits and alternatives for the proposed anesthesia with the patient or authorized representative who has indicated his/her understanding and acceptance.   Dental advisory given  Plan Discussed with: CRNA and Surgeon  Anesthesia Plan Comments:        Anesthesia Quick Evaluation

## 2011-08-19 NOTE — Op Note (Signed)
Hernia, Open, Procedure Note  Indications: The patient presented with a history of a right, not reducible inguinal hernia.    Pre-operative Diagnosis: Massive right non-reducible inguinal hernia Post-operative Diagnosis: same  Surgeon: Wynona Luna.   Assistants: None  Anesthesia: General endotracheal anesthesia  ASA Class: 3  Procedure Details  The patient was seen again in the Holding Room. The risks, benefits, complications, treatment options, and expected outcomes were discussed with the patient. The possibilities of reaction to medication, pulmonary aspiration, perforation of viscus, bleeding, recurrent infection, the need for additional procedures, and development of a complication requiring transfusion or further operation were discussed with the patient and/or family. The likelihood of success in repairing the hernia and returning the patient to their previous functional status is good.  There was concurrence with the proposed plan, and informed consent was obtained. The site of surgery was properly noted/marked. The patient was taken to the Operating Room, identified as Austin Baker, and the procedure verified as right inguinal hernia repair. A Time Out was held and the above information confirmed.  The patient was placed in the supine position and underwent induction of anesthesia. The lower abdomen, groin, and scrotum were prepped with Chloraprep and draped in the standard fashion, and 0.5% Marcaine with epinephrine was used to anesthetize the skin over the enormous scrotal hernia sac. An oblique incision was made. Dissection was carried down through the subcutaneous tissue with cautery to the surface of the hernia sac.  The hernia sac was meticulously dissected free from the testicle.  The testicle seemed intact with no masses.  The hernia sac was opened and contained a massive amount of omentum, but no colon or small bowel.  We resected most of the herniated omentum with a  Ligasure.  The remainder of the omentum was reduced back through the large direct hernia defect.  The hernia sac was ligated with 2-0 silk and the excess sac was amputated.  We opened the external oblique fascia along the direction of its fibers to the external ring.  The spermatic cord was circumferentially dissected bluntly and retracted with a Penrose drain.  The floor of the inguinal canal was inspected and showed a large direct defect.  We skeletonized the spermatic cord and finished reducing the indirect portion of the pantaloon hernia.  The floor of the inguinal canal was closed with 0 Vicryl.  We used a 3 x 6 inch piece of Ultrapro mesh, which was cut into a keyhole shape.  This was secured with 2-0 Prolene, beginning at the pubic tubercle, running this along the internal oblique fascia superiorly and the shelving edge inferiorly.  The tails of the mesh were sutured together behind the spermatic cord.  The mesh was tucked underneath the external oblique fascia laterally.  The external oblique fascia was reapproximated with 2-0 Vicryl.  3-0 Vicryl was used to close the subcutaneous tissues and 4-0 Monocryl was used to close the skin in subcuticular fashion.  Benzoin and steri-strips were used to seal the incision.  A clean dressing was applied.  The patient was then extubated and brought to the recovery room in stable condition.  All sponge, instrument, and needle counts were correct prior to closure and at the conclusion of the case.   Estimated Blood Loss: less than 50 mL                 Complications: None; patient tolerated the procedure well.         Disposition: PACU - hemodynamically stable.  Condition: stable  Austin Baker. Austin Skains, MD, [facility]  08/19/2011 9:31 AM

## 2011-08-19 NOTE — Interval H&P Note (Signed)
History and Physical Interval Note:  08/19/2011 7:27 AM  Austin Baker  has presented today for surgery, with the diagnosis of right inguinal hernia  The various methods of treatment have been discussed with the patient and family. After consideration of risks, benefits and other options for treatment, the patient has consented to  Procedure(s) (LRB): HERNIA REPAIR INGUINAL ADULT (Right) INSERTION OF MESH (Right) as a surgical intervention .  The patient's history has been reviewed, patient examined, no change in status, stable for surgery.  I have reviewed the patient's chart and labs.  Questions were answered to the patient's satisfaction.     Austin Baker K.

## 2011-08-19 NOTE — Transfer of Care (Signed)
Immediate Anesthesia Transfer of Care Note  Patient: Austin Baker  Procedure(s) Performed: Procedure(s) (LRB): HERNIA REPAIR INGUINAL ADULT (Right) INSERTION OF MESH (Right)  Patient Location: PACU  Anesthesia Type: General  Level of Consciousness: awake, alert , oriented and patient cooperative  Airway & Oxygen Therapy: Patient Spontanous Breathing and Patient connected to face mask oxygen  Post-op Assessment: Report given to PACU RN  Post vital signs: Reviewed and stable  Complications: No apparent anesthesia complications

## 2011-08-20 ENCOUNTER — Encounter (HOSPITAL_COMMUNITY): Payer: Self-pay | Admitting: Surgery

## 2011-08-20 LAB — BASIC METABOLIC PANEL
BUN: 18 mg/dL (ref 6–23)
Calcium: 9.1 mg/dL (ref 8.4–10.5)
GFR calc Af Amer: 57 mL/min — ABNORMAL LOW (ref >90)
GFR calc non Af Amer: 49 mL/min — ABNORMAL LOW (ref >90)
Glucose, Bld: 118 mg/dL — ABNORMAL HIGH (ref 70–99)
Sodium: 138 mEq/L (ref 135–145)

## 2011-08-20 LAB — CBC
Hemoglobin: 12.9 g/dL — ABNORMAL LOW (ref 13.0–17.0)
MCH: 29.7 pg (ref 26.0–34.0)
MCHC: 36 g/dL (ref 30.0–36.0)
RDW: 13.8 % (ref 11.5–15.5)

## 2011-08-20 MED ORDER — OXYCODONE-ACETAMINOPHEN 5-325 MG PO TABS: 1.0000 | ORAL_TABLET | ORAL | Status: AC | PRN

## 2011-08-20 NOTE — Discharge Summary (Signed)
Physician Discharge Summary  Patient ID: Austin Baker MRN: [id number] DOB/AGE: May 10, 1940 71 y.o.  Admit date: 08/19/2011 Discharge date: 08/20/2011  Admission Diagnoses:Large right inguinal hernia  Discharge Diagnoses: same Active Problems:  * No active hospital problems. *    Discharged Condition: good  Hospital Course: Open repair of large right inguinal hernia with mesh 08/19/11  Consults: None  Significant Diagnostic Studies: none  Treatments: surgery: RIH repair with mesh  Discharge Exam: Blood pressure 132/86, pulse 89, temperature 98.1 F (36.7 C), temperature source Axillary, resp. rate 18, height 6\' 2"  (1.88 m), weight 178 lb 1.6 oz (80.786 kg), SpO2 98.00%. moderate amount of swelling under incision - hematoma or seroma; dressing dry  Disposition:   Discharge Orders    Future Orders Please Complete By Expires   Diet general      Increase activity slowly      May walk up steps      May shower / Bathe      Driving Restrictions      Comments:   Do not drive while taking pain medications   Call MD for:  temperature >100.4      Call MD for:  persistant nausea and vomiting      Call MD for:  severe uncontrolled pain      Call MD for:  redness, tenderness, or signs of infection (pain, swelling, redness, odor or green/yellow discharge around incision site)        Medication List  As of 08/20/2011  8:34 AM   TAKE these medications         amLODipine 10 MG tablet   Commonly known as: NORVASC   Take 1 tablet (10 mg total) by mouth daily.      aspirin EC 81 MG tablet   Take 1 tablet (81 mg total) by mouth daily.      chlorproMAZINE 25 MG tablet   Commonly known as: THORAZINE   Take 1 tablet (25 mg total) by mouth 3 (three) times daily.      metoprolol succinate 100 MG 24 hr tablet   Commonly known as: TOPROL-XL   Take 1 tablet (100 mg total) by mouth daily.      oxyCODONE-acetaminophen 5-325 MG per tablet   Commonly known as: PERCOCET/ROXICET   Take 1-2  tablets by mouth every 4 (four) hours as needed.      quinapril 40 MG tablet   Commonly known as: ACCUPRIL   Take 1 tablet (40 mg total) by mouth daily.      triamterene-hydrochlorothiazide 37.5-25 MG per tablet   Commonly known as: MAXZIDE-25   Take 1 each (1 tablet total) by mouth daily.           Follow-up Information    Follow up with Greyson Riccardi K., MD. Schedule an appointment as soon as possible for a visit in 3 weeks.   Contact information:   [address] [place] [address] [phone]          Signed: Wynona Luna. 08/20/2011, 8:34 AM

## 2011-08-20 NOTE — Progress Notes (Signed)
1 Day Post-Op  Subjective: Patient reports minimal discomfort  Objective: Vital signs in last 24 hours: Temp:  [97.4 F (36.3 C)-98.5 F (36.9 C)] 98.1 F (36.7 C) (08/09 0600) Pulse Rate:  [83-104] 89  (08/09 0600) Resp:  [16-27] 18  (08/09 0600) BP: (132-164)/(81-95) 132/86 mmHg (08/09 0600) SpO2:  [96 %-100 %] 98 % (08/09 0600) FiO2 (%):  [2 %] 2 % (08/08 2200) Weight:  [178 lb 1.6 oz (80.786 kg)] 178 lb 1.6 oz (80.786 kg) (08/08 1045) Last BM Date: 08/19/11  Intake/Output from previous day: 08/08 0701 - 08/09 0700 In: 2679 [I.V.:2679] Out: 500 [Urine:500] Intake/Output this shift: Total I/O In: -  Out: 150 [Urine:150]  General appearance: alert, cooperative and no distress Incision - dressing dry; moderate amount of swelling - likely seroma or hematoma in the very large space after reducing this large hernia  Lab Results:   Basename 08/20/11 0640 08/19/11 1200  WBC 12.4* 16.5*  HGB 12.9* 13.6  HCT 35.8* 38.0*  PLT 196 210   BMET  Basename 08/20/11 0640 08/19/11 1200 08/19/11 0634  NA 138 -- --  K 3.1* -- 3.9  CL 103 -- --  CO2 26 -- --  GLUCOSE 118* -- --  BUN 18 -- --  CREATININE 1.42* 1.32 --  CALCIUM 9.1 -- --   PT/INR No results found for this basename: LABPROT:2,INR:2 in the last 72 hours ABG No results found for this basename: PHART:2,PCO2:2,PO2:2,HCO3:2 in the last 72 hours  Studies/Results: No results found.  Anti-infectives: Anti-infectives     Start     Dose/Rate Route Frequency Ordered Stop   08/19/11 1400   ceFAZolin (ANCEF) IVPB 1 g/50 mL premix        1 g 100 mL/hr over 30 Minutes Intravenous Every 6 hours 08/19/11 1043 08/20/11 0212   08/18/11 1417   ceFAZolin (ANCEF) IVPB 2 g/50 mL premix  Status:  Discontinued        2 g 100 mL/hr over 30 Minutes Intravenous 60 min pre-op 08/18/11 1417 08/19/11 1034          Assessment/Plan: s/p Procedure(s) (LRB): HERNIA REPAIR INGUINAL ADULT (Right) INSERTION OF MESH  (Right) Discharge  LOS: 1 day    Tikisha Molinaro K. 08/20/2011

## 2011-08-20 NOTE — Progress Notes (Signed)
DC instructions verbally read to pt and sister. No concerns or questions asked.

## 2011-08-20 NOTE — Progress Notes (Signed)
Physical Therapy Evaluation Patient Details Name: Austin Baker MRN: [id number] DOB: 27-Jun-1940 Today's Date: 08/20/2011 Time: 1030-1101 PT Time Calculation (min): 31 min  PT Assessment / Plan / Recommendation Clinical Impression  71 yo M s/p large hernia repair with h/o mulitple strokes and blindness.  Pt with mobiity deficits and hard to discern what is new and what is premorbid.  Pt reports being nervous here secondary unfamilar environment.  He also normally wears shoes to ambulate and he did not have those here. Pt anticipated to DC home today wtih fmaily support.  Pt does not feel he will have difficulties at home.  One sister present for majority of PT session.     PT Assessment  Patent does not need any further PT services (secondary to anticipated DC home today)    Follow Up Recommendations  No PT follow up (If he doesnt return to baseline mobility at home may need HH)    Barriers to Discharge        Equipment Recommendations  Rolling walker with 5" wheels    Recommendations for Other Services     Frequency      Precautions / Restrictions Precautions Precautions: Fall Restrictions Weight Bearing Restrictions: No         Mobility  Bed Mobility Bed Mobility: Not assessed (pt sitting EOB upomn PT's arrival) Transfers Transfers: Sit to Stand;Stand to Sit Sit to Stand: 4: Min guard;From bed (. Performed twice) Stand to Sit: 5: Supervision;To bed Ambulation/Gait Ambulation/Gait Assistance: 4: Min guard (Min gaurd A with RW,  Mod A with cane) Ambulation Distance (Feet): 40 Feet (40 with RW, 15 feet with cane) Assistive device: Rolling walker;Straight cane Ambulation/Gait Assistance Details: Pt much steadier with RW than cane.  Pt agreeable to use RW at home. Gait Pattern: Step-to pattern;Decreased stride length Gait velocity: gait very slow General Gait Details: pt reports he normally ambulates with shoes but did not hve them here Stairs: No (no stairs easliy  accessable to practice on)    Exercises     PT Diagnosis:    PT Problem List:   PT Treatment Interventions:     PT Goals    Visit Information  Last PT Received On: 08/20/11 Assistance Needed: +1    Subjective Data  Patient Stated Goal: to return home   Prior Functioning  Home Living Lives With: Other (Comment) (sister) Available Help at Discharge: Family;Available 24 hours/day Type of Home: Apartment Home Access: Stairs to enter [employer] of Steps: Flight-18 Entrance Stairs-Rails: Can reach both Home Layout: One level Home Adaptive Equipment: Straight cane Prior Function Level of Independence: Independent with assistive device(s) (within house for mobility.  Has assist with stairs and ADL's) Able to Take Stairs?: Yes Driving: No Vocation: Retired Musician: Other (comment) (speech can be difficult to understand at times)    Cognition  Overall Cognitive Status: Appears within functional limits for tasks assessed/performed Arousal/Alertness: Awake/alert Orientation Level: Appears intact for tasks assessed Behavior During Session: [facility] for tasks performed    Extremity/Trunk Assessment Right Upper Extremity Assessment RUE ROM/Strength/Tone: [facility] for tasks assessed Left Upper Extremity Assessment LUE ROM/Strength/Tone: [facility] for tasks assessed Right Lower Extremity Assessment RLE ROM/Strength/Tone: [facility] for tasks assessed Left Lower Extremity Assessment LLE ROM/Strength/Tone: [facility] for tasks assessed   Balance Balance Balance Assessed: No (requires assistive device to stand)  End of Session PT - End of Session Equipment Utilized During Treatment: Gait belt Activity Tolerance: Patient tolerated treatment well Patient left: in bed;with family/visitor present (pt sitting EOB.  Instructed to only walk with nursing staff) Nurse Communication: Mobility status (need to walk again today.  Need for RW at DC)  GP Functional Assessment Tool Used:  Clinical judgement Functional Limitation: Mobility: Walking and moving around Mobility: Walking and Moving Around Current Status [phone]): At least 20 percent but less than 40 percent impaired, limited or restricted Mobility: Walking and Moving Around Goal Status [phone]): At least 20 percent but less than 40 percent impaired, limited or restricted Mobility: Walking and Moving Around Discharge Status [phone]): At least 20 percent but less than 40 percent impaired, limited or restricted   Donnella Sham 08/20/2011, 11:20 AM  Lavona Mound, PT  [phone] 08/20/2011

## 2011-09-02 ENCOUNTER — Encounter (INDEPENDENT_AMBULATORY_CARE_PROVIDER_SITE_OTHER): Payer: Self-pay | Admitting: Surgery

## 2011-09-02 ENCOUNTER — Ambulatory Visit (INDEPENDENT_AMBULATORY_CARE_PROVIDER_SITE_OTHER): Payer: PRIVATE HEALTH INSURANCE | Admitting: Surgery

## 2011-09-02 VITALS — BP 134/72 | HR 96 | Temp 97.4°F | Resp 20 | Ht 72.5 in | Wt 168.0 lb

## 2011-09-02 DIAGNOSIS — K4090 Unilateral inguinal hernia, without obstruction or gangrene, not specified as recurrent: Secondary | ICD-10-CM

## 2011-09-02 NOTE — Progress Notes (Signed)
Status post open repair of a massive right inguinal hernia on 08/19/11. The patient reports no problems. No discomfort. He has had a large hematoma accumulate in the dead space where his previous hernia had been. This does not enlarge with Valsalva maneuver. His incision seems to be healing well with no sign of infection. His activity is limited because he is wheelchair-bound. The hematoma should resolve over time. Recheck in one month.  Wilmon Arms. Corliss Skains, MD, [facility]  09/02/2011 3:16 PM

## 2011-09-09 ENCOUNTER — Encounter: Payer: Self-pay | Admitting: Internal Medicine

## 2011-10-05 ENCOUNTER — Encounter (INDEPENDENT_AMBULATORY_CARE_PROVIDER_SITE_OTHER): Payer: Self-pay | Admitting: Surgery

## 2011-10-05 ENCOUNTER — Ambulatory Visit (INDEPENDENT_AMBULATORY_CARE_PROVIDER_SITE_OTHER): Payer: PRIVATE HEALTH INSURANCE | Admitting: Surgery

## 2011-10-05 VITALS — BP 120/82 | HR 90 | Temp 98.9°F | Resp 18 | Ht 72.5 in | Wt 160.5 lb

## 2011-10-05 DIAGNOSIS — K4090 Unilateral inguinal hernia, without obstruction or gangrene, not specified as recurrent: Secondary | ICD-10-CM

## 2011-10-05 NOTE — Progress Notes (Signed)
Recheck of his right inguinal hernia  Incision. The swelling has gone down considerably. No pain. The incision is completely healed with no sign of infection. He may resume full activity. Followup as needed.  Wilmon Arms. Corliss Skains, MD, [facility]  10/05/2011 3:53 PM

## 2011-11-12 ENCOUNTER — Other Ambulatory Visit: Payer: Self-pay | Admitting: *Deleted

## 2011-11-12 DIAGNOSIS — R066 Hiccough: Secondary | ICD-10-CM

## 2011-11-12 NOTE — Telephone Encounter (Signed)
This medication is for hiccups.  I tried calling Austin Baker to confirm that treatment was still necessary.  I received an unidentified voicemail and did not leave a message.  Alternatives include metoclopramide 10 mg PO TID to QID and Baclofen 10 mg PO TID.  Will try speaking with Austin Baker on Monday to assure pharmacologic therapy is still appropriate.

## 2011-11-12 NOTE — Telephone Encounter (Signed)
There is a Journalist, newspaper on this med

## 2011-11-16 ENCOUNTER — Encounter: Payer: Self-pay | Admitting: Internal Medicine

## 2011-11-16 ENCOUNTER — Ambulatory Visit (INDEPENDENT_AMBULATORY_CARE_PROVIDER_SITE_OTHER): Payer: PRIVATE HEALTH INSURANCE | Admitting: Internal Medicine

## 2011-11-16 VITALS — BP 136/87 | HR 88 | Temp 98.7°F | Resp 20 | Ht 71.0 in | Wt 175.3 lb

## 2011-11-16 DIAGNOSIS — I6350 Cerebral infarction due to unspecified occlusion or stenosis of unspecified cerebral artery: Secondary | ICD-10-CM

## 2011-11-16 DIAGNOSIS — Z23 Encounter for immunization: Secondary | ICD-10-CM

## 2011-11-16 DIAGNOSIS — I10 Essential (primary) hypertension: Secondary | ICD-10-CM

## 2011-11-16 DIAGNOSIS — N259 Disorder resulting from impaired renal tubular function, unspecified: Secondary | ICD-10-CM

## 2011-11-16 DIAGNOSIS — R066 Hiccough: Secondary | ICD-10-CM

## 2011-11-16 DIAGNOSIS — Z8639 Personal history of other endocrine, nutritional and metabolic disease: Secondary | ICD-10-CM

## 2011-11-16 DIAGNOSIS — K4090 Unilateral inguinal hernia, without obstruction or gangrene, not specified as recurrent: Secondary | ICD-10-CM

## 2011-11-16 DIAGNOSIS — E876 Hypokalemia: Secondary | ICD-10-CM

## 2011-11-16 DIAGNOSIS — Z862 Personal history of diseases of the blood and blood-forming organs and certain disorders involving the immune mechanism: Secondary | ICD-10-CM

## 2011-11-16 DIAGNOSIS — I498 Other specified cardiac arrhythmias: Secondary | ICD-10-CM

## 2011-11-16 DIAGNOSIS — N289 Disorder of kidney and ureter, unspecified: Secondary | ICD-10-CM

## 2011-11-16 MED ORDER — METOPROLOL SUCCINATE ER 100 MG PO TB24: 100.0000 mg | ORAL_TABLET | Freq: Every day | ORAL | Status: DC

## 2011-11-16 MED ORDER — CHLORPROMAZINE HCL 25 MG PO TABS: 25.0000 mg | ORAL_TABLET | Freq: Two times a day (BID) | ORAL | Status: DC

## 2011-11-16 MED ORDER — TRIAMTERENE-HCTZ 37.5-25 MG PO TABS: 1.0000 | ORAL_TABLET | Freq: Every day | ORAL | Status: DC

## 2011-11-16 MED ORDER — QUINAPRIL HCL 40 MG PO TABS: 40.0000 mg | ORAL_TABLET | Freq: Every day | ORAL | Status: DC

## 2011-11-16 MED ORDER — AMLODIPINE BESYLATE 10 MG PO TABS: 10.0000 mg | ORAL_TABLET | Freq: Every day | ORAL | Status: DC

## 2011-11-16 NOTE — Patient Instructions (Addendum)
Please make a follow up appointment in 6 months. Call for early appointment if needed. Will check cholesterol levels and kidney functions today.  Please keep taking all medications regularly. All meds are refilled and are in pharmacy.

## 2011-11-16 NOTE — Assessment & Plan Note (Signed)
Well-healed status post right inguinal hernia repair.

## 2011-11-16 NOTE — Assessment & Plan Note (Addendum)
Lab Results  Component Value Date   NA 138 08/20/2011   K 3.1* 08/20/2011   CL 103 08/20/2011   CO2 26 08/20/2011   BUN 18 08/20/2011   CREATININE 1.42* 08/20/2011   CREATININE 1.58* 06/01/2011    BP Readings from Last 3 Encounters:  11/16/11 136/87  10/05/11 120/82  09/02/11 134/72    Assessment: Hypertension control:  controlled  Progress toward goals:  at goal Barriers to meeting goals:  no barriers identified  Plan: Hypertension treatment:  continue current medications continue Maxzide, Norvasc, quinapril, metoprolol. Refilled of medication today.

## 2011-11-16 NOTE — Assessment & Plan Note (Signed)
Check BMP today 

## 2011-11-16 NOTE — Assessment & Plan Note (Signed)
Chronic hiccups well-controlled on Thorazine. Refilled Thorazine- 25 mg twice daily.

## 2011-11-16 NOTE — Assessment & Plan Note (Signed)
Last CVA in 1996. Not on aspirin post surgery. Sister reports at surgeon recommended holding aspirin for 3 months has hernia was huge. Will restart in 3 months after surgery which will be due soon.

## 2011-11-16 NOTE — Progress Notes (Signed)
  Subjective:    Patient ID: Austin Baker, male    DOB: 12-13-1940, 72 y.o.   MRN: [id number]  HPI patient is a 71 year old man with past history of glaucoma, blindness, hypertension with recent inguinal hernia surgery comes for regular 6 month visit. He had large inguinal hernia which got operated in August- and had 2 followup appointments with surgeon after that- with positive reports of good healing.  Patient is accompanied by his sister who takes care of him at home. He is blind. He apparently denies any nausea vomiting, fever, chills abdominal pain, chest pain, short of breath, diarrhea.  Sister asked about getting lab tests were done today which he gets every 6 months   Review of Systems    As per history of present illness, all other systems reviewed and negative. Objective:   Physical Exam  General: resting in bed HEENT: Eyes rolled up. Patient blind. No icterus. Cardiac: S1, S2, RRR, no rubs, murmurs or gallops Pulm: clear to auscultation bilaterally, moving normal volumes of air Abd: soft, nontender, nondistended, BS present Ext: warm and well perfused, no pedal edema Neuro: alert and oriented X3, cranial nerves II-XII grossly intact He      Assessment & Plan:

## 2011-11-16 NOTE — Assessment & Plan Note (Signed)
Last potassium 3.1 on 08/20/2011.  Recheck BMP today.

## 2011-11-17 LAB — BASIC METABOLIC PANEL WITH GFR
BUN: 19 mg/dL (ref 6–23)
CO2: 26 mEq/L (ref 19–32)
Chloride: 98 mEq/L (ref 96–112)
GFR, Est African American: 53 mL/min — ABNORMAL LOW
GFR, Est Non African American: 46 mL/min — ABNORMAL LOW
Potassium: 3.3 mEq/L — ABNORMAL LOW (ref 3.5–5.3)
Sodium: 131 mEq/L — ABNORMAL LOW (ref 135–145)

## 2011-11-17 LAB — LIPID PANEL
Cholesterol: 151 mg/dL (ref 0–200)
LDL Cholesterol: 81 mg/dL (ref 0–99)
Total CHOL/HDL Ratio: 3.4 Ratio
VLDL: 26 mg/dL (ref 0–40)

## 2011-11-22 NOTE — Telephone Encounter (Signed)
I tried calling Austin Baker today and still received an unidentified voice mail.  No message was left.  Will continue to try to call periodically to confirm therapy is still necessary.

## 2011-12-03 NOTE — Telephone Encounter (Signed)
I was able to speak with his caregiver (sister who is POA).  He was able to get the thorazine.  Therefore, this is no longer an issue.

## 2012-03-06 ENCOUNTER — Other Ambulatory Visit: Payer: Self-pay | Admitting: *Deleted

## 2012-03-06 DIAGNOSIS — I10 Essential (primary) hypertension: Secondary | ICD-10-CM

## 2012-03-06 NOTE — Telephone Encounter (Signed)
I am happy to discuss issue with Mr. Haliburton at his follow-up visit.  Please schedule him in the next available non-overbook appointment with Dr. Josem Kaufmann as I have yet to meet him.  Thanks.

## 2012-03-06 NOTE — Telephone Encounter (Signed)
Pt states this Rx is only written for # 30 instead of # 31 a month.  The request is to change to 31 a month.

## 2012-03-08 ENCOUNTER — Encounter: Payer: Self-pay | Admitting: Internal Medicine

## 2012-05-18 ENCOUNTER — Encounter: Payer: PRIVATE HEALTH INSURANCE | Admitting: Internal Medicine

## 2012-05-19 ENCOUNTER — Encounter: Payer: PRIVATE HEALTH INSURANCE | Admitting: Internal Medicine

## 2012-06-09 ENCOUNTER — Other Ambulatory Visit: Payer: Self-pay | Admitting: Internal Medicine

## 2012-06-09 DIAGNOSIS — R066 Hiccough: Secondary | ICD-10-CM

## 2012-07-04 ENCOUNTER — Encounter: Payer: PRIVATE HEALTH INSURANCE | Admitting: Internal Medicine

## 2012-11-20 ENCOUNTER — Ambulatory Visit (INDEPENDENT_AMBULATORY_CARE_PROVIDER_SITE_OTHER): Payer: PRIVATE HEALTH INSURANCE | Admitting: Internal Medicine

## 2012-11-20 ENCOUNTER — Encounter: Payer: Self-pay | Admitting: Internal Medicine

## 2012-11-20 VITALS — BP 125/84 | HR 72 | Temp 99.2°F | Ht 72.0 in | Wt 175.5 lb

## 2012-11-20 DIAGNOSIS — R066 Hiccough: Secondary | ICD-10-CM

## 2012-11-20 DIAGNOSIS — I498 Other specified cardiac arrhythmias: Secondary | ICD-10-CM

## 2012-11-20 DIAGNOSIS — Z23 Encounter for immunization: Secondary | ICD-10-CM

## 2012-11-20 DIAGNOSIS — I10 Essential (primary) hypertension: Secondary | ICD-10-CM

## 2012-11-20 MED ORDER — AMLODIPINE BESYLATE 10 MG PO TABS: 10.0000 mg | ORAL_TABLET | Freq: Every day | ORAL | Status: DC

## 2012-11-20 MED ORDER — QUINAPRIL HCL 40 MG PO TABS: 40.0000 mg | ORAL_TABLET | Freq: Every day | ORAL | Status: DC

## 2012-11-20 MED ORDER — TRIAMTERENE-HCTZ 37.5-25 MG PO TABS: 1.0000 | ORAL_TABLET | Freq: Every day | ORAL | Status: DC

## 2012-11-20 MED ORDER — METOPROLOL SUCCINATE ER 100 MG PO TB24: 100.0000 mg | ORAL_TABLET | Freq: Every day | ORAL | Status: DC

## 2012-11-20 MED ORDER — CHLORPROMAZINE HCL 25 MG PO TABS: 25.0000 mg | ORAL_TABLET | Freq: Two times a day (BID) | ORAL | Status: DC

## 2012-11-20 NOTE — Patient Instructions (Addendum)
General Instructions:  We have refilled your medications today. Follow-up in 6 months.  Treatment Goals:  Goals (1 Years of Data) as of 11/20/12         As of Today 11/16/11     Blood Pressure    . Blood Pressure < 140/90  125/84 136/87      Progress Toward Treatment Goals:  Treatment Goal 11/20/2012  Blood pressure at goal    Self Care Goals & Plans:  Self Care Goal 11/20/2012  Manage my medications take my medicines as prescribed; refill my medications on time; bring my medications to every visit    No flowsheet data found.   Care Management & Community Referrals:  No flowsheet data found.

## 2012-11-20 NOTE — Assessment & Plan Note (Signed)
BP Readings from Last 3 Encounters:  11/20/12 125/84  11/16/11 136/87  10/05/11 120/82    Lab Results  Component Value Date   NA 131* 11/16/2011   K 3.3* 11/16/2011   CREATININE 1.53* 11/16/2011    Assessment: Blood pressure control: controlled Progress toward BP goal:  at goal Comments:   Plan: Medications:  continue current medications Educational resources provided:   Self management tools provided:   Other plans: cont metoprolol, amlodipine, quinapril, triamterene-HCT

## 2012-11-20 NOTE — Progress Notes (Signed)
  Subjective:    Patient ID: Austin Baker, male    DOB: 10/19/1940, 72 y.o.   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 Presents for routine f/u. Sister is primary caregiver.  Both without complaints just requesting refills of medications.  Review of Systems  Constitutional: Negative.   HENT: Negative.   Respiratory: Negative.   Cardiovascular: Negative.   Gastrointestinal: Negative.   Genitourinary: Negative.   Neurological:       Chronic hiccups       Objective:   Physical Exam  Constitutional: He is oriented to person, place, and time. He appears well-developed and well-nourished. No distress.  Seated in wheelchair but ambulates in the home, dark sunglasses on  HENT:  Head: Normocephalic and atraumatic.  Neck: Neck supple.  Pulmonary/Chest: Effort normal and breath sounds normal.  Abdominal: Soft. Bowel sounds are normal.  Musculoskeletal: He exhibits no edema and no tenderness.  Neurological: He is alert and oriented to person, place, and time.  Skin: Skin is warm and dry.  Psychiatric: He has a normal mood and affect.          Assessment & Plan:  See separate problem list charting:  #1 hypertension: at goal  #2 chronic hiccups: controlled with chlopomezine

## 2012-11-21 LAB — BASIC METABOLIC PANEL
BUN: 23 mg/dL (ref 6–23)
Calcium: 9.7 mg/dL (ref 8.4–10.5)
Creat: 1.56 mg/dL — ABNORMAL HIGH (ref 0.50–1.35)
Glucose, Bld: 103 mg/dL — ABNORMAL HIGH (ref 70–99)

## 2012-11-21 LAB — LIPID PANEL
Cholesterol: 148 mg/dL (ref 0–200)
HDL: 44 mg/dL (ref >39)
Total CHOL/HDL Ratio: 3.4 Ratio

## 2012-11-22 NOTE — Progress Notes (Signed)
Case discussed with Dr. Schooler soon after the resident saw the patient.  We reviewed the resident's history and exam and pertinent patient test results.  I agree with the assessment, diagnosis and plan of care documented in the resident's note. 

## 2013-07-04 ENCOUNTER — Other Ambulatory Visit: Payer: Self-pay | Admitting: *Deleted

## 2013-07-04 DIAGNOSIS — I10 Essential (primary) hypertension: Secondary | ICD-10-CM

## 2013-07-04 DIAGNOSIS — I498 Other specified cardiac arrhythmias: Secondary | ICD-10-CM

## 2013-07-04 MED ORDER — METOPROLOL SUCCINATE ER 100 MG PO TB24: 100.0000 mg | ORAL_TABLET | Freq: Every day | ORAL | Status: DC

## 2013-07-04 NOTE — Telephone Encounter (Signed)
Needs routine F/U appt with new PCP next 90 days

## 2013-07-04 NOTE — Telephone Encounter (Signed)
Flag sent to front desk pool for appt per Dr Rogelia BogaButcher.

## 2013-07-11 ENCOUNTER — Encounter: Payer: Self-pay | Admitting: Internal Medicine

## 2013-09-26 ENCOUNTER — Encounter: Payer: PRIVATE HEALTH INSURANCE | Admitting: Internal Medicine

## 2013-10-09 ENCOUNTER — Ambulatory Visit: Payer: PRIVATE HEALTH INSURANCE | Admitting: Internal Medicine

## 2013-11-09 ENCOUNTER — Other Ambulatory Visit: Payer: Self-pay | Admitting: *Deleted

## 2013-11-09 DIAGNOSIS — I10 Essential (primary) hypertension: Secondary | ICD-10-CM

## 2013-11-09 NOTE — Telephone Encounter (Signed)
Last visit 11/20/12 Scheduled appointment 11/26/13

## 2013-11-10 MED ORDER — QUINAPRIL HCL 40 MG PO TABS: 40.0000 mg | ORAL_TABLET | Freq: Every day | ORAL | Status: DC

## 2013-11-26 ENCOUNTER — Encounter: Payer: Self-pay | Admitting: Internal Medicine

## 2013-11-26 ENCOUNTER — Ambulatory Visit (INDEPENDENT_AMBULATORY_CARE_PROVIDER_SITE_OTHER): Payer: PRIVATE HEALTH INSURANCE | Admitting: Internal Medicine

## 2013-11-26 ENCOUNTER — Ambulatory Visit (INDEPENDENT_AMBULATORY_CARE_PROVIDER_SITE_OTHER): Payer: PRIVATE HEALTH INSURANCE | Admitting: *Deleted

## 2013-11-26 ENCOUNTER — Encounter: Payer: PRIVATE HEALTH INSURANCE | Admitting: Internal Medicine

## 2013-11-26 VITALS — BP 144/85 | HR 73 | Temp 97.9°F | Ht 73.0 in | Wt 171.4 lb

## 2013-11-26 DIAGNOSIS — I639 Cerebral infarction, unspecified: Secondary | ICD-10-CM

## 2013-11-26 DIAGNOSIS — I6350 Cerebral infarction due to unspecified occlusion or stenosis of unspecified cerebral artery: Secondary | ICD-10-CM

## 2013-11-26 DIAGNOSIS — I10 Essential (primary) hypertension: Secondary | ICD-10-CM

## 2013-11-26 DIAGNOSIS — Z0000 Encounter for general adult medical examination without abnormal findings: Secondary | ICD-10-CM | POA: Insufficient documentation

## 2013-11-26 DIAGNOSIS — Z23 Encounter for immunization: Secondary | ICD-10-CM

## 2013-11-26 DIAGNOSIS — R059 Cough, unspecified: Secondary | ICD-10-CM | POA: Insufficient documentation

## 2013-11-26 DIAGNOSIS — R05 Cough: Secondary | ICD-10-CM

## 2013-11-26 DIAGNOSIS — Z8673 Personal history of transient ischemic attack (TIA), and cerebral infarction without residual deficits: Secondary | ICD-10-CM

## 2013-11-26 MED ORDER — ASPIRIN EC 81 MG PO TBEC: 81.0000 mg | DELAYED_RELEASE_TABLET | Freq: Every day | ORAL | Status: AC

## 2013-11-26 NOTE — Progress Notes (Signed)
   Subjective:    Patient ID: Austin SandiferHarold Baker, male    DOB: 01/11/1941, 73 y.o.   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 History was obtained primarily from sister who is caregiver. The only complaint is his a dry cough. He had it a few months ago and he took OTC robitussen and went away but now it is back for the past few days. His sister says he has allergies and coughing with changes of season. He otherwise has no complaints.  Review of Systems  Constitutional: Negative for fever, chills, diaphoresis, appetite change, fatigue and unexpected weight change.  HENT: Negative for congestion, postnasal drip, rhinorrhea and sore throat.   Respiratory: Positive for cough. Negative for shortness of breath.   Cardiovascular: Negative for chest pain and palpitations.  Gastrointestinal: Negative for nausea, vomiting, abdominal pain, diarrhea and constipation.  Allergic/Immunologic: Positive for environmental allergies.  Neurological: Negative for dizziness, weakness, light-headedness, numbness and headaches.       Objective:   Physical Exam  Constitutional: He is oriented to person, place, and time. He appears well-developed and well-nourished. No distress.  Sitting in wheelchair  HENT:  Head: Normocephalic and atraumatic.  Mouth/Throat: Oropharynx is clear and moist.  Cardiovascular: Normal rate, regular rhythm, normal heart sounds and intact distal pulses.  Exam reveals no gallop and no friction rub.   No murmur heard. Pulmonary/Chest: Effort normal and breath sounds normal. No respiratory distress. He has no wheezes.  Abdominal: Soft. Bowel sounds are normal. He exhibits no distension. There is no tenderness.  Musculoskeletal: Normal range of motion. He exhibits no edema.  Neurological: He is alert and oriented to person, place, and time.  Skin: He is not diaphoretic.  Vitals reviewed.           Assessment & Plan:

## 2013-11-26 NOTE — Progress Notes (Signed)
I saw and evaluated the patient.  I personally confirmed the key portions of the history and exam documented by Dr. Rothman and I reviewed pertinent patient test results.  The assessment, diagnosis, and plan were formulated together and I agree with the documentation in the resident's note. 

## 2013-11-26 NOTE — Assessment & Plan Note (Signed)
BP Readings from Last 3 Encounters:  11/26/13 144/85  11/20/12 125/84  11/16/11 136/87    Lab Results  Component Value Date   NA 136 11/20/2012   K 3.7 11/20/2012   CREATININE 1.56* 11/20/2012    Assessment: Blood pressure control: mildly elevated Progress toward BP goal:  deteriorated Comments: borderline elevated  Plan: Medications:  continue current medications amlodipine 10 mg daily, quinapril 40 mg daily, toprol-xl 100 mg daily, triamteren-HCTZ 37.5-25 mg daily Educational resources provided:   Self management tools provided:   Other plans: check BMP today; RTC 6 months and reassess

## 2013-11-26 NOTE — Assessment & Plan Note (Signed)
Mr Austin Baker received the flu shot and Tdap. Patient and sister deferred colonoscopy referral at this time.

## 2013-11-26 NOTE — Patient Instructions (Signed)
It was a pleasure to see you today. Please take your blood pressure medicines as you have been and please take the aspirin daily. We will check your electrolytes and cholesterol and call you if there are any issues. Please return to clinic or seek medical attention if you have any new or worsening cough, shortness of breath, fever, or other worrisome medical condition. We look forward to seeing you again in 6 months.  Farley LyAdam Carlisle Torgeson, MD  General Instructions:   Please bring your medicines with you each time you come to clinic.  Medicines may include prescription medications, over-the-counter medications, herbal remedies, eye drops, vitamins, or other pills.   Progress Toward Treatment Goals:  Treatment Goal 11/20/2012  Blood pressure at goal    Self Care Goals & Plans:  Self Care Goal 11/26/2013  Manage my medications take my medicines as prescribed; bring my medications to every visit; refill my medications on time  Eat healthy foods drink diet soda or water instead of juice or soda; eat more vegetables; eat foods that are low in salt; eat baked foods instead of fried foods    No flowsheet data found.   Care Management & Community Referrals:  No flowsheet data found.

## 2013-11-26 NOTE — Assessment & Plan Note (Signed)
See HPI. This is just a few days old and the differential is wide. The sister says he has a cough when the weather changes and also allergies although he denies any congestion or rhinorrhea. She believes this is the cause. He has no other constitutional symptoms and his lungs were CTAB. He does have a remote history of smoking but stopped over 40 years ago. -otc robitussen -return to clinic if no improvement in 2 weeks

## 2013-11-26 NOTE — Assessment & Plan Note (Signed)
This problem is stable. He lives with his sister who is a caregiver. She says that his functionality has not changed. He continues to ambulate on his own, use the toilet on own, make his own bed, etc. She does not note any strength deficits. He used to take ASA 81 mg but chart review reveals that his sister was resistant in the past as it sometimes upset his stomach. She is amenable to him trying enteric coating ASA. She is also interested in a lipid panel, which has been normal the past few years, as they are resistant to a statin. -ASA ec 81 mg daily -lipid panel

## 2013-11-27 ENCOUNTER — Telehealth: Payer: Self-pay | Admitting: Internal Medicine

## 2013-11-27 LAB — LIPID PANEL
CHOL/HDL RATIO: 3.1 ratio
Cholesterol: 148 mg/dL (ref 0–200)
HDL: 47 mg/dL (ref >39)
LDL Cholesterol: 74 mg/dL (ref 0–99)
Triglycerides: 133 mg/dL (ref <150)
VLDL: 27 mg/dL (ref 0–40)

## 2013-11-27 LAB — BASIC METABOLIC PANEL WITH GFR
BUN: 17 mg/dL (ref 6–23)
CHLORIDE: 99 meq/L (ref 96–112)
CO2: 26 meq/L (ref 19–32)
CREATININE: 1.35 mg/dL (ref 0.50–1.35)
Calcium: 9.8 mg/dL (ref 8.4–10.5)
GFR, Est African American: 61 mL/min
GFR, Est Non African American: 52 mL/min — ABNORMAL LOW
GLUCOSE: 107 mg/dL — AB (ref 70–99)
POTASSIUM: 3.7 meq/L (ref 3.5–5.3)
Sodium: 136 mEq/L (ref 135–145)

## 2013-11-27 NOTE — Telephone Encounter (Signed)
I called Mr Austin Baker's sister, [name] at approximately 9 am today. The patient told me yesterday during our appointment that I should share the results of his labs with his sister who serves as his caregiver. I told her that his labs all returned normal and his cholesterol level is the same as last year. She was very thankful for the call and had no questions.  Farley Lyothman, Jonelle Bann, MD 11/27/13 9:05 am

## 2013-12-10 ENCOUNTER — Other Ambulatory Visit: Payer: Self-pay | Admitting: *Deleted

## 2013-12-10 DIAGNOSIS — R066 Hiccough: Secondary | ICD-10-CM

## 2013-12-10 DIAGNOSIS — I10 Essential (primary) hypertension: Secondary | ICD-10-CM

## 2013-12-10 MED ORDER — AMLODIPINE BESYLATE 10 MG PO TABS: 10.0000 mg | ORAL_TABLET | Freq: Every day | ORAL | Status: DC

## 2013-12-10 MED ORDER — TRIAMTERENE-HCTZ 37.5-25 MG PO TABS: 1.0000 | ORAL_TABLET | Freq: Every day | ORAL | Status: DC

## 2013-12-10 MED ORDER — CHLORPROMAZINE HCL 25 MG PO TABS: 25.0000 mg | ORAL_TABLET | Freq: Two times a day (BID) | ORAL | Status: DC

## 2013-12-10 MED ORDER — METOPROLOL SUCCINATE ER 100 MG PO TB24: 100.0000 mg | ORAL_TABLET | Freq: Every day | ORAL | Status: DC

## 2013-12-10 NOTE — Telephone Encounter (Signed)
Also needs refills on amlopipine, metoprolol succinate and generic Maxzide. - all to same pharmacy. Stanton KidneyDebra Lameeka Schleifer RN 12/10/13 4:30PM

## 2013-12-11 NOTE — Telephone Encounter (Signed)
Message left on home phone recording - four Rx were filled for one year 12/10/13.

## 2014-02-14 ENCOUNTER — Other Ambulatory Visit: Payer: Self-pay | Admitting: *Deleted

## 2014-02-14 DIAGNOSIS — I10 Essential (primary) hypertension: Secondary | ICD-10-CM

## 2014-02-14 MED ORDER — METOPROLOL SUCCINATE ER 100 MG PO TB24: 100.0000 mg | ORAL_TABLET | Freq: Every day | ORAL | Status: DC

## 2014-02-14 NOTE — Telephone Encounter (Signed)
Please refill for # 31 so patient has enough each month.

## 2014-05-21 ENCOUNTER — Encounter: Payer: Self-pay | Admitting: *Deleted

## 2014-08-22 ENCOUNTER — Telehealth: Payer: Self-pay | Admitting: Internal Medicine

## 2014-08-22 NOTE — Telephone Encounter (Signed)
Call to patient to confirm appointment for 08/23/14 at 3:30 lmtcb

## 2014-08-23 ENCOUNTER — Ambulatory Visit (INDEPENDENT_AMBULATORY_CARE_PROVIDER_SITE_OTHER): Payer: Medicare Other | Admitting: Internal Medicine

## 2014-08-23 ENCOUNTER — Encounter: Payer: Self-pay | Admitting: Internal Medicine

## 2014-08-23 VITALS — BP 132/89 | HR 78 | Temp 98.4°F | Wt 167.5 lb

## 2014-08-23 DIAGNOSIS — Z7982 Long term (current) use of aspirin: Secondary | ICD-10-CM

## 2014-08-23 DIAGNOSIS — Z23 Encounter for immunization: Secondary | ICD-10-CM

## 2014-08-23 DIAGNOSIS — Z0000 Encounter for general adult medical examination without abnormal findings: Secondary | ICD-10-CM

## 2014-08-23 DIAGNOSIS — I6350 Cerebral infarction due to unspecified occlusion or stenosis of unspecified cerebral artery: Secondary | ICD-10-CM

## 2014-08-23 DIAGNOSIS — N182 Chronic kidney disease, stage 2 (mild): Secondary | ICD-10-CM

## 2014-08-23 DIAGNOSIS — I129 Hypertensive chronic kidney disease with stage 1 through stage 4 chronic kidney disease, or unspecified chronic kidney disease: Secondary | ICD-10-CM | POA: Diagnosis not present

## 2014-08-23 DIAGNOSIS — I10 Essential (primary) hypertension: Secondary | ICD-10-CM

## 2014-08-23 DIAGNOSIS — I69398 Other sequelae of cerebral infarction: Secondary | ICD-10-CM | POA: Diagnosis not present

## 2014-08-23 DIAGNOSIS — N400 Enlarged prostate without lower urinary tract symptoms: Secondary | ICD-10-CM

## 2014-08-23 MED ORDER — METOPROLOL SUCCINATE ER 100 MG PO TB24: 100.0000 mg | ORAL_TABLET | Freq: Every day | ORAL | Status: DC

## 2014-08-23 NOTE — Assessment & Plan Note (Signed)
Cr roughly stable over previous several years. No new symptomatic complaints. Will monitor Bmet at next visit.

## 2014-08-23 NOTE — Assessment & Plan Note (Signed)
Managing adequate urination throughout the day, currently experiencing 0-1 episodes of nocturia on average.

## 2014-08-23 NOTE — Assessment & Plan Note (Signed)
No recent new focal symptoms or acute events. He does not have any abdominal pain with current aspirin treatment. Residual deficit and left foot rotation are fixed and longstanding.  -Continue ASA 

## 2014-08-23 NOTE — Assessment & Plan Note (Addendum)
BP Readings from Last 3 Encounters:  08/23/14 132/89  11/26/13 144/85  11/20/12 125/84    Lab Results  Component Value Date   NA 136 11/26/2013   K 3.7 11/26/2013   CREATININE 1.35 11/26/2013    Assessment: Blood pressure control: At goal Progress toward BP goal:  Stable Comments: Well controlled on current regimen  Plan: Medications: Continue current meds: Metoprolol succinate , amlodipine , maxzide 37.5-25mg , quinapril  Educational resources provided:   Self management tools provided:  Other plans: Repeat Bmet at next routine follow up for chronic diuretic use

## 2014-08-23 NOTE — Progress Notes (Signed)
Subjective:   Patient ID: Austin Baker male   DOB: 1940/04/16 74 y.o.   MRN: [id number]  HPI: [name] is a 74 y.o. 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 He was originally scheduled for earlier follow up but rescheduled in order to meet with PCP opening. He is accompanied by sister who is his primary caregiver. He has no new complaints over the interval. His previous dry nonprodutcive cough is improved and no longer requires cough medicine. He does have minor sinus symptoms related to seasonal allergic rhinitis.  See problem based assessment and plan for additional details.  Past Medical History  Diagnosis Date  . Hiccups   . BPH (benign prostatic hypertrophy)   . Hypokalemia   . CVA (cerebral infarction)   . Hypertension   . Glaucoma   . Urosepsis     history of  . Renal insufficiency     Baseline Cr 1.5  . 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 HYPOMAGNESEMIA 03/31/2006  . Stroke   . Urinary tract infection     hx of  . Right inguinal hernia 07/13/2011    Status post open repair 08/19/11    Current Outpatient Prescriptions  Medication Sig Dispense Refill  . amLODipine (NORVASC) 10 MG tablet Take 1 tablet (10 mg total) by mouth daily. 31 tablet 11  . aspirin EC 81 MG tablet Take 1 tablet (81 mg total) by mouth daily. 150 tablet 2  . chlorproMAZINE (THORAZINE) 25 MG tablet Take 1 tablet (25 mg total) by mouth 2 (two) times daily. 60 tablet 11  . metoprolol succinate (TOPROL XL) 100 MG 24 hr tablet Take 1 tablet (100 mg total) by mouth daily. Take with or immediately following a meal. 31 tablet 11  . quinapril (ACCUPRIL) 40 MG tablet Take 1 tablet (40 mg total) by mouth daily. 31 tablet 11  . triamterene-hydrochlorothiazide (MAXZIDE-25) 37.5-25 MG per tablet Take 1 tablet by mouth daily. 31 tablet 11   No current facility-administered medications for this visit.    Family History  Problem Relation Age of Onset  . Diabetes Mother   . Hypertension Mother    Social History   Social History  . Marital Status: Single    Spouse Name: N/A  . Number of Children: N/A  . Years of Education: N/A   Social History Main Topics  . Smoking status: Former Smoker    Types: Cigarettes    Quit date: 01/11/1970  . Smokeless tobacco: Never Used  . Alcohol Use: No  . Drug Use: No  . Sexual Activity: No   Other Topics Concern  . Not on file   Social History Narrative   Review of Systems: Review of Systems  Constitutional: Negative for fever, chills and weight loss.  Eyes:       Blind  Respiratory: Negative for cough and shortness of breath.   Cardiovascular: Negative for chest pain, palpitations and leg swelling.  Gastrointestinal: Negative for diarrhea and constipation.  Genitourinary: Negative for frequency.  Musculoskeletal: Positive for back pain.  Skin: Negative for rash.  Neurological: Negative for dizziness and headaches.  Psychiatric/Behavioral: The patient does not have insomnia.     Objective:  Physical Exam: Filed Vitals:   08/23/14 1548  BP: 132/89  Pulse: 78  Temp: 98.4 F (36.9 C)  TempSrc: Oral  Weight: 167 lb 8 oz (75.978 kg)   GENERAL-  pleasant, sitting comfortably in wheelchair, NAD HEENT- Blind, eyes mostly deviated upward, oral mucosa appears moist, poor dentition teeth mostly intact only on lower left CARDIAC- RRR, no murmurs, rubs or gallops. RESP- CTAB, no wheezes or crackles. ABDOMEN- Soft, nontender, no guarding or rebound, normoactive bowel sounds present BACK- Normal curvature, no paraspinal tenderness, no CVA tenderness. NEURO- Blind, no other obvious cranial nerve deficits, 4/5 lower extremity strength b/l, sensation grossly intact EXTREMITIES- Left foot externally rotated chronically, no pedal edema SKIN- Warm, dry, No rash or lesion. PSYCH- Normal mood and affect, appropriate thought content and  speech.  Assessment & Plan:

## 2014-08-23 NOTE — Patient Instructions (Signed)
Today we gave the Prevnar vaccine shot for pneumonia. This medication helps protect against the most common forms of bacterial pneemonia and the benefit lasts for up to 10 years.  Your metoprolol was changed to 31 pills per bottle, so it will not run out more quickly than your other medications.  Continue doing well, we will see you in 6 months or sooner if you need [place] for any reason.

## 2014-08-23 NOTE — Assessment & Plan Note (Addendum)
Administered PCV-13 vaccine today. He reports having had a colonoscopy more recently than our records indicate. Will try to obtain records or else recommend additional screening colonoscopy or FOBT. Last given FOBT cards with negative result in 2013 from chart review.

## 2014-08-26 ENCOUNTER — Telehealth: Payer: Self-pay | Admitting: Internal Medicine

## 2014-08-26 NOTE — Telephone Encounter (Signed)
Verified md's name, dea, npi

## 2014-08-26 NOTE — Telephone Encounter (Signed)
Jerrel Ivory from [facility] [employer] on [address] about pt Rx.Please call Jerrel Ivory back @ [phone].

## 2014-08-26 NOTE — Progress Notes (Signed)
Called to pharm 

## 2014-08-26 NOTE — Progress Notes (Signed)
Internal Medicine Clinic Attending  I saw and evaluated the patient.  I personally confirmed the key portions of the history and exam documented by Dr. Rice and I reviewed pertinent patient test results.  The assessment, diagnosis, and plan were formulated together and I agree with the documentation in the resident's note.  

## 2014-11-06 ENCOUNTER — Other Ambulatory Visit: Payer: Self-pay | Admitting: Internal Medicine

## 2014-11-06 NOTE — Telephone Encounter (Signed)
NEEDS MEDICATION REFILLS,QUINAPRIL 40 MG, [facility] [phone]

## 2014-11-06 NOTE — Telephone Encounter (Signed)
Already done.Austin Baker, Austin Cassady10/26/20164:41 PM

## 2014-12-08 ENCOUNTER — Other Ambulatory Visit: Payer: Self-pay | Admitting: Internal Medicine

## 2014-12-09 NOTE — Telephone Encounter (Signed)
Patient requesting Norvasc,Thorazine,Maxzide and Accupril to be refilled.  Patient only has 2 pills left and states he normally gets more than a 1 month supply and is not due to come back until April.

## 2015-04-03 ENCOUNTER — Telehealth: Payer: Self-pay | Admitting: Internal Medicine

## 2015-04-03 NOTE — Telephone Encounter (Signed)
APPT. REMINDER CALL, LMTCB °

## 2015-04-04 ENCOUNTER — Encounter: Payer: Self-pay | Admitting: Internal Medicine

## 2015-04-04 ENCOUNTER — Ambulatory Visit (INDEPENDENT_AMBULATORY_CARE_PROVIDER_SITE_OTHER): Payer: Medicare Other | Admitting: Internal Medicine

## 2015-04-04 VITALS — BP 122/77 | HR 75 | Temp 97.9°F | Ht 77.0 in | Wt 174.0 lb

## 2015-04-04 DIAGNOSIS — N39498 Other specified urinary incontinence: Secondary | ICD-10-CM

## 2015-04-04 DIAGNOSIS — N401 Enlarged prostate with lower urinary tract symptoms: Secondary | ICD-10-CM

## 2015-04-04 DIAGNOSIS — R32 Unspecified urinary incontinence: Secondary | ICD-10-CM

## 2015-04-04 NOTE — Progress Notes (Signed)
Subjective:   Patient ID: Austin Baker male   DOB: [birth year] 75 y.o.   MRN: [id number]  HPI: Austin Baker is a 75 y.o. man with past medical history as described below presenting for worsening bowel and bladder incontinence. He has chronically suffered occasional overflow incontinence due to BPH but has worsened progressively in the past 3 months. He is cared for by his daughter and son at home where he was noted to wet his pants 4+ times daily and now is dressed with adult diapers. He has also started passed formed stools abruptly which is new. He denies any abdominal pain or dysuria. He is not sure about whether he feels urgency prior to voiding. His family also reports some worsening of his gait now reaching for objects rather than walking to them and spending more time in his wheelchair during these 3 months.  See problem based assessment and plan below for additional details.  Past Medical History  Diagnosis Date  . Hiccups   . BPH (benign prostatic hypertrophy)   . Hypokalemia   . CVA (cerebral infarction)   . Hypertension   . Glaucoma   . Urosepsis     history of  . Renal insufficiency     Baseline Cr 1.5  . 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 HYPOMAGNESEMIA 03/31/2006  . Stroke ([facility])   . Urinary tract infection     hx of  . Right inguinal hernia 07/13/2011    Status post open repair 08/19/11    Current Outpatient Prescriptions  Medication Sig Dispense Refill  . amLODipine (NORVASC) 10 MG tablet TAKE ONE TABLET BY MOUTH ONCE DAILY 31 tablet 5  . chlorproMAZINE (THORAZINE) 25 MG tablet TAKE ONE TABLET BY MOUTH TWICE DAILY 60 tablet 5  . metoprolol succinate (TOPROL XL) 100 MG 24 hr tablet Take 1 tablet (100 mg total) by mouth daily. Take with or immediately following a meal. 31 tablet 11  . quinapril (ACCUPRIL) 40 MG tablet TAKE ONE TABLET BY MOUTH ONCE DAILY 31 tablet 0  . quinapril (ACCUPRIL) 40 MG tablet TAKE ONE TABLET BY MOUTH ONCE DAILY 31 tablet 5    No current facility-administered medications for this visit.   Family History  Problem Relation Age of Onset  . Diabetes Mother   . Hypertension Mother    Social History   Social History  . Marital Status: Single    Spouse Name: N/A  . Number of Children: N/A  . Years of Education: N/A   Social History Main Topics  . Smoking status: Former Smoker    Types: Cigarettes    Quit date: 01/11/1970  . Smokeless tobacco: Never Used  . Alcohol Use: No  . Drug Use: No  . Sexual Activity: No   Other Topics Concern  . None   Social History Narrative   Review of Systems: Review of Systems  Constitutional: Negative for fever.  Eyes:       Blindness  Respiratory: Negative for shortness of breath.   Cardiovascular: Positive for leg swelling.  Gastrointestinal: Negative for abdominal pain and blood in stool.  Genitourinary: Positive for urgency and frequency. Negative for dysuria and hematuria.  Musculoskeletal: Negative for falls.  Skin: Negative for rash.  Neurological: Negative for dizziness.  Psychiatric/Behavioral: Positive for memory loss.    Objective:  Physical Exam: Filed Vitals:   04/04/15 1607  BP: 122/77  Pulse: 75  Temp: 97.9 F (36.6 C)  TempSrc: Oral  Height:  (  1.956 m)  Weight: 174 lb (78.926 kg)  SpO2: 99%   GENERAL- pleasant, sitting comfortably in wheelchair, NAD HEENT- Blind, oral mucosa appears moist, poor dentition few teeth intact on lower left CARDIAC- RRR, no murmurs, rubs or gallops. RESP- CTAB, no wheezes or crackles. ABDOMEN- Soft, nontender, no guarding or rebound GENITOURINARY- Small scrotal edema, without tenderness, erythema, or warmth, no lesions or discharge NEURO- Blind, no other obvious cranial nerve deficits, 4/5 lower extremity strength b/l, sensation grossly intact EXTREMITIES- Left foot externally rotated, 2+ pitting edema on lower half of shins SKIN- Warm, dry, No rash or lesion.  PSYCH- Poor memory recall, appropriate  mood and speech  Assessment & Plan:

## 2015-04-04 NOTE — Patient Instructions (Signed)
Today we discussed worsening incontinence of bowels and bladder and gait instability. This could be caused by compression of the spinal cord which is best visualized by MRI. We will contact radiology to arrange an appointment for this image.  I also discontinued the triamterene-hydrochlorothiazide which is a diuretic-medication that causes you to urinate. We will need to recheck blood work and blood pressure at next appointment after holding this medicine.

## 2015-04-07 NOTE — Assessment & Plan Note (Signed)
Assessment: 3 month progressive incontinence of urine and feces with possibly concurrent gait worsening in this 75 y/o man with cardiovascular disease and numerous prior strokes. He does not have any significant parasthesias or localized weakness on examination. He has not had any recent traumas or other illness to explain these symptoms. He does have baseline urinary dysfunction with BPH.  These symptoms could be due to progressive lumbar spinal stenosis. They could also be signs of progressive vascular dementia. These seem the more likely causes in the setting of progressive symptoms for months and no specific abdominal symptoms.  Plan: -Lumbar spine MRI to rule out spinal stenosis -Discontinue triamterene-HCTZ to decrease urinary frequency -Blood pressure check at follow up to decide on restarting diuretic or not -Return visit in approximately 2 weeks/after MRI results -If nothing on spine imaging will need to reassess for any other organic cause of his incontinence as vascular dementia would have limited options for intervention

## 2015-04-07 NOTE — Progress Notes (Signed)
Internal Medicine Clinic Attending  Case discussed with Dr. Rice at the time of the visit.  We reviewed the resident's history and exam and pertinent patient test results.  I agree with the assessment, diagnosis, and plan of care documented in the resident's note.  

## 2015-04-21 ENCOUNTER — Ambulatory Visit (HOSPITAL_COMMUNITY): Payer: Medicare Other

## 2015-04-25 ENCOUNTER — Ambulatory Visit (HOSPITAL_COMMUNITY)
Admission: RE | Admit: 2015-04-25 | Discharge: 2015-04-25 | Disposition: A | Discharge status: Home / Self Care | Payer: Medicare Other | Source: Ambulatory Visit | Attending: Internal Medicine | Admitting: Internal Medicine

## 2015-04-25 DIAGNOSIS — R159 Full incontinence of feces: Secondary | ICD-10-CM | POA: Diagnosis not present

## 2015-04-25 DIAGNOSIS — M5126 Other intervertebral disc displacement, lumbar region: Secondary | ICD-10-CM | POA: Insufficient documentation

## 2015-04-25 DIAGNOSIS — R269 Unspecified abnormalities of gait and mobility: Secondary | ICD-10-CM | POA: Insufficient documentation

## 2015-04-25 DIAGNOSIS — R32 Unspecified urinary incontinence: Secondary | ICD-10-CM

## 2015-04-25 DIAGNOSIS — M4806 Spinal stenosis, lumbar region: Secondary | ICD-10-CM | POA: Insufficient documentation

## 2015-06-04 ENCOUNTER — Other Ambulatory Visit: Payer: Self-pay | Admitting: Internal Medicine

## 2015-06-05 NOTE — Telephone Encounter (Signed)
Received electronic refill request from pt's pharmacy. Refill request sent to pcp.  Last appt 04/04/2015, message also sent to front office pool to schedule patient an appt as he was supposed to f/u 2wks after last ov.Kingsley SpittleGoldston, Tomma Ehinger Cassady5/25/20178:48 AM

## 2015-06-23 ENCOUNTER — Ambulatory Visit (INDEPENDENT_AMBULATORY_CARE_PROVIDER_SITE_OTHER): Payer: Medicare Other | Admitting: Pulmonary Disease

## 2015-06-23 ENCOUNTER — Encounter: Payer: Self-pay | Admitting: Pulmonary Disease

## 2015-06-23 VITALS — BP 146/88 | HR 66 | Temp 99.2°F | Wt 173.9 lb

## 2015-06-23 DIAGNOSIS — N182 Chronic kidney disease, stage 2 (mild): Secondary | ICD-10-CM

## 2015-06-23 DIAGNOSIS — I129 Hypertensive chronic kidney disease with stage 1 through stage 4 chronic kidney disease, or unspecified chronic kidney disease: Secondary | ICD-10-CM

## 2015-06-23 DIAGNOSIS — R32 Unspecified urinary incontinence: Secondary | ICD-10-CM | POA: Diagnosis not present

## 2015-06-23 DIAGNOSIS — I10 Essential (primary) hypertension: Secondary | ICD-10-CM

## 2015-06-23 MED ORDER — AMLODIPINE BESYLATE 10 MG PO TABS: 10.0000 mg | ORAL_TABLET | Freq: Every day | ORAL | Status: DC

## 2015-06-23 MED ORDER — TRIAMTERENE-HCTZ 37.5-25 MG PO CAPS: 1.0000 | ORAL_CAPSULE | Freq: Every day | ORAL | Status: DC

## 2015-06-23 MED ORDER — CHLORPROMAZINE HCL 25 MG PO TABS: 25.0000 mg | ORAL_TABLET | Freq: Two times a day (BID) | ORAL | Status: DC

## 2015-06-23 MED ORDER — QUINAPRIL HCL 40 MG PO TABS: 40.0000 mg | ORAL_TABLET | Freq: Every day | ORAL | Status: DC

## 2015-06-23 MED ORDER — METOPROLOL SUCCINATE ER 100 MG PO TB24: 100.0000 mg | ORAL_TABLET | Freq: Every day | ORAL | Status: DC

## 2015-06-23 NOTE — Patient Instructions (Signed)
We will refer you for a specialist for more testing We will call you if there are any abnormalities in your tests ordered today Please follow up in 3-6 months for your regular check up with your primary care physician

## 2015-06-23 NOTE — Progress Notes (Signed)
   Subjective:    Patient ID: Austin Baker, male    DOB: 03/29/1940, 75 y.o.   MRN: [id number]  HPI Austin Baker is a 75 year old man with history of HTN, BPH, CVA, CKD2 presenting for follow up of hypertension. Accompanied by his sister and her partner.   Had hernia surgery 08/2011. Sister says his penis is retracted since the surgery. Thinks this is contributing to his urinary incontinence.   Was seen 04/04/2015 for urinary/fecal incontinence.  Lumbar spine MRI: No critical spinal stenosis or distal conus lesion that might contribute to urinary/fecal incontinence. Discontinued triamterene-HCTZ.  No change in urinary incontinence. Does not have trouble with initiating urine stream.   Review of Systems Constitutional: no fevers/chills Respiratory: no shortness of breath Cardiovascular: no chest pain Gastrointestinal: no nausea/vomiting, no abdominal pain, no constipation, no diarrhea Genitourinary: no dysuria, no hematuria  Past Medical History  Diagnosis Date  . Hiccups   . BPH (benign prostatic hypertrophy)   . Hypokalemia   . CVA (cerebral infarction)   . Hypertension   . Glaucoma   . Urosepsis     history of  . Renal insufficiency     Baseline Cr 1.5  . 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 HYPOMAGNESEMIA 03/31/2006  . Stroke (HCC)   . Urinary tract infection     hx of  . Right inguinal hernia 07/13/2011    Status post open repair 08/19/11     Current Outpatient Prescriptions on File Prior to Visit  Medication Sig Dispense Refill  . amLODipine (NORVASC) 10 MG tablet TAKE ONE TABLET BY MOUTH ONCE DAILY 31 tablet 0  . chlorproMAZINE (THORAZINE) 25 MG tablet TAKE ONE TABLET BY MOUTH TWICE DAILY 60 tablet 0  . metoprolol succinate (TOPROL XL) 100 MG 24 hr tablet Take 1 tablet (100 mg total) by mouth daily. Take with or immediately following a meal. 31 tablet 11  . quinapril (ACCUPRIL) 40 MG tablet TAKE ONE TABLET BY MOUTH ONCE DAILY 31 tablet 0   No current  facility-administered medications on file prior to visit.      Objective:   Physical Exam Blood pressure 139/91, pulse 80, temperature 99.2 F (37.3 C), temperature source Oral, weight 173 lb 14.4 oz (78.881 kg), SpO2 100 %. General Apperance: NAD HEENT: Normocephalic, atraumatic Neck: Supple, trachea midline Lungs: Clear to auscultation bilaterally. No wheezes, rhonchi or rales. Breathing comfortably Heart: Regular rate and rhythm, no murmur/rub/gallop Abdomen: Soft, nontender, nondistended, no rebound/guarding GU: Penis with foreskin attached. Appearance normal. No hernia palpated on exam. Extremities: Warm and well perfused, no edema Skin: No rashes or lesions Neurologic: Alert and interactive.     Assessment & Plan:  Please refer to problem based charting.

## 2015-06-24 LAB — BMP8+ANION GAP
ANION GAP: 18 mmol/L (ref 10.0–18.0)
BUN / CREAT RATIO: 11 (ref 10–24)
BUN: 14 mg/dL (ref 8–27)
CO2: 21 mmol/L (ref 18–29)
CREATININE: 1.31 mg/dL — AB (ref 0.76–1.27)
Calcium: 9.5 mg/dL (ref 8.6–10.2)
Chloride: 101 mmol/L (ref 96–106)
GFR, EST AFRICAN AMERICAN: 62 mL/min/{1.73_m2} (ref >59)
GFR, EST NON AFRICAN AMERICAN: 54 mL/min/{1.73_m2} — AB (ref >59)
Glucose: 93 mg/dL (ref 65–99)
POTASSIUM: 3.4 mmol/L — AB (ref 3.5–5.2)
SODIUM: 140 mmol/L (ref 134–144)

## 2015-06-24 LAB — VITAMIN B12: Vitamin B-12: 495 pg/mL (ref 211–946)

## 2015-06-24 LAB — TSH: TSH: 2.99 u[IU]/mL (ref 0.450–4.500)

## 2015-06-24 NOTE — Assessment & Plan Note (Signed)
Assessment: No change in incontinence since last visit. His sister thinks that he has been having trouble with incontinence since after his hernia surgery several years ago - this would be unlikely to cause urinary and stool incontinence. Lumbar spine MRI unremarkable. Some of this seems behavioral. There is some possible symptoms that would be consistent with vascular dementia - he mentioned to his sister that if he had his vision back he could do better with urinating even though he has been blind since birth. TSH and vitamin B12 normal.  Plan: -CT head without contrast -Neurocognitive referral/evaluation

## 2015-06-24 NOTE — Assessment & Plan Note (Signed)
Cr stable. Potassium just below normal but will be restarting triamterene.  Continue to monitor BMP

## 2015-06-24 NOTE — Assessment & Plan Note (Signed)
Assessment: BP deteriorated to 139/91  Plan: Continue amlodipine 10mg  daily, metoprolol succinate 100mg  daily, quinapril 40mg  daily. Restart triamterene-hctz 37.5-25mg  daily

## 2015-06-25 NOTE — Progress Notes (Signed)
Internal Medicine Clinic Attending  Case discussed with Dr. Krall at the time of the visit.  We reviewed the resident's history and exam and pertinent patient test results.  I agree with the assessment, diagnosis, and plan of care documented in the resident's note.  

## 2015-07-03 ENCOUNTER — Ambulatory Visit (HOSPITAL_COMMUNITY): Payer: Medicare Other

## 2015-07-04 ENCOUNTER — Other Ambulatory Visit: Payer: Self-pay | Admitting: Pulmonary Disease

## 2015-07-04 DIAGNOSIS — R32 Unspecified urinary incontinence: Secondary | ICD-10-CM

## 2015-07-08 ENCOUNTER — Encounter: Payer: Self-pay | Admitting: Internal Medicine

## 2015-07-18 ENCOUNTER — Ambulatory Visit (HOSPITAL_COMMUNITY)
Admission: RE | Admit: 2015-07-18 | Discharge: 2015-07-18 | Disposition: A | Discharge status: Home / Self Care | Payer: Medicare Other | Source: Ambulatory Visit | Attending: Internal Medicine | Admitting: Internal Medicine

## 2015-07-18 DIAGNOSIS — R32 Unspecified urinary incontinence: Secondary | ICD-10-CM | POA: Diagnosis not present

## 2015-07-18 DIAGNOSIS — G9389 Other specified disorders of brain: Secondary | ICD-10-CM | POA: Diagnosis not present

## 2015-07-18 DIAGNOSIS — G319 Degenerative disease of nervous system, unspecified: Secondary | ICD-10-CM | POA: Diagnosis not present

## 2015-07-18 DIAGNOSIS — Q040 Congenital malformations of corpus callosum: Secondary | ICD-10-CM | POA: Diagnosis not present

## 2015-07-18 DIAGNOSIS — R4182 Altered mental status, unspecified: Secondary | ICD-10-CM | POA: Diagnosis not present

## 2015-07-18 DIAGNOSIS — Z8673 Personal history of transient ischemic attack (TIA), and cerebral infarction without residual deficits: Secondary | ICD-10-CM | POA: Diagnosis not present

## 2015-08-15 NOTE — Addendum Note (Signed)
Addended by: Dorie Rank E on: 08/15/2015 01:09 PM   Modules accepted: Orders

## 2015-09-05 ENCOUNTER — Other Ambulatory Visit: Payer: Self-pay | Admitting: Pulmonary Disease

## 2015-09-24 ENCOUNTER — Other Ambulatory Visit: Payer: Self-pay | Admitting: *Deleted

## 2015-09-26 MED ORDER — CHLORPROMAZINE HCL 25 MG PO TABS: 25.0000 mg | ORAL_TABLET | Freq: Two times a day (BID) | ORAL | 1 refills | Status: DC

## 2015-09-29 ENCOUNTER — Telehealth: Payer: Self-pay | Admitting: *Deleted

## 2015-09-29 NOTE — Telephone Encounter (Signed)
Calling [place] back

## 2015-09-29 NOTE — Telephone Encounter (Signed)
Received faxed PA from pt's pharmacy for his chlorpromazine 25mg  .  This medication in non-preferred, pharmacy recommends changing to a preferred medication which includes fluphenazine or haloperidol.  Attempted to contact pt's family to inform them of potential medication change, no answer, message left on recorder.  Will now send to pcp for review and medication change if appropriate.  Please advise.Austin AlvineGoldston, Austin Lonigro Cassady9/18/201711:00 AM

## 2015-10-07 ENCOUNTER — Other Ambulatory Visit: Payer: Self-pay | Admitting: Internal Medicine

## 2015-11-05 ENCOUNTER — Other Ambulatory Visit: Payer: Self-pay | Admitting: Pulmonary Disease

## 2015-11-06 ENCOUNTER — Telehealth: Payer: Self-pay | Admitting: *Deleted

## 2015-11-06 DIAGNOSIS — I10 Essential (primary) hypertension: Secondary | ICD-10-CM

## 2015-11-06 DIAGNOSIS — R066 Hiccough: Secondary | ICD-10-CM

## 2015-11-06 MED ORDER — CHLORPROMAZINE HCL 25 MG PO TABS: 25.0000 mg | ORAL_TABLET | Freq: Two times a day (BID) | ORAL | 0 refills | Status: DC

## 2015-11-06 MED ORDER — AMLODIPINE BESYLATE 10 MG PO TABS: 10.0000 mg | ORAL_TABLET | Freq: Every day | ORAL | 3 refills | Status: DC

## 2015-11-06 MED ORDER — QUINAPRIL HCL 40 MG PO TABS: 40.0000 mg | ORAL_TABLET | Freq: Every day | ORAL | 3 refills | Status: DC

## 2015-11-06 NOTE — Telephone Encounter (Signed)
amLODipine (NORVASC) 10 MG tablet,  chlorproMAZINE (THORAZINE) 25 MG tablet,  quinapril (ACCUPRIL) 40 MG tablet, refill request.

## 2015-11-06 NOTE — Telephone Encounter (Signed)
The thorazine appears to be for his chronic hiccups.  This was last commented on in the medical record in 2013.  I will ask his PCP to document if this therapy is still effective or required.  I refilled a 1 month supply with no further refills to give his PCP the time to properly address and document.

## 2016-01-02 ENCOUNTER — Encounter: Payer: Self-pay | Admitting: Internal Medicine

## 2016-01-02 ENCOUNTER — Encounter (INDEPENDENT_AMBULATORY_CARE_PROVIDER_SITE_OTHER): Payer: Self-pay

## 2016-01-02 ENCOUNTER — Ambulatory Visit (INDEPENDENT_AMBULATORY_CARE_PROVIDER_SITE_OTHER): Payer: Medicare Other | Admitting: Internal Medicine

## 2016-01-02 VITALS — BP 139/114 | HR 77 | Temp 98.8°F | Ht 77.0 in | Wt 170.2 lb

## 2016-01-02 DIAGNOSIS — Z0000 Encounter for general adult medical examination without abnormal findings: Secondary | ICD-10-CM

## 2016-01-02 DIAGNOSIS — Z87891 Personal history of nicotine dependence: Secondary | ICD-10-CM

## 2016-01-02 DIAGNOSIS — R32 Unspecified urinary incontinence: Secondary | ICD-10-CM

## 2016-01-02 DIAGNOSIS — H547 Unspecified visual loss: Secondary | ICD-10-CM

## 2016-01-02 DIAGNOSIS — I10 Essential (primary) hypertension: Secondary | ICD-10-CM | POA: Diagnosis not present

## 2016-01-02 DIAGNOSIS — I6350 Cerebral infarction due to unspecified occlusion or stenosis of unspecified cerebral artery: Secondary | ICD-10-CM

## 2016-01-02 DIAGNOSIS — Z23 Encounter for immunization: Secondary | ICD-10-CM

## 2016-01-02 DIAGNOSIS — R278 Other lack of coordination: Secondary | ICD-10-CM | POA: Diagnosis not present

## 2016-01-02 DIAGNOSIS — H7292 Unspecified perforation of tympanic membrane, left ear: Secondary | ICD-10-CM

## 2016-01-02 DIAGNOSIS — Z79899 Other long term (current) drug therapy: Secondary | ICD-10-CM

## 2016-01-02 DIAGNOSIS — I69398 Other sequelae of cerebral infarction: Secondary | ICD-10-CM | POA: Diagnosis not present

## 2016-01-02 DIAGNOSIS — Z7982 Long term (current) use of aspirin: Secondary | ICD-10-CM

## 2016-01-02 DIAGNOSIS — N401 Enlarged prostate with lower urinary tract symptoms: Secondary | ICD-10-CM

## 2016-01-02 NOTE — Progress Notes (Signed)
   CC: Need for influenza vaccination  HPI:  [name] is a 75 y.o. man with HTN, BPH, multiple CVA, blindness presenting for a routine follow up and needing his flu shot today. He has also continued to have bowel and bladder incontinence that is characterized by urge limited from his mobility or with hm not able to anticipate his voiding. He also has recently had some left ear pain and decreased hearing sicne instrumenting his ear with Q-tips ten later a portable radio antenna because of feeling like he had water in it.  See problem based assessment and plan below for additional details.  Past Medical History:  Diagnosis Date  . BPH (benign prostatic hypertrophy)   . CVA (cerebral infarction)   . Glaucoma   . Hiccups   . Hypertension   . Hypokalemia   . HYPOMAGNESEMIA 03/31/2006  . Lipoma    pedunculated on left belt line  . Renal insufficiency    Baseline Cr 1.5  . Right inguinal hernia 07/13/2011   Status post open repair 08/19/11   . Stroke (HCC)   . Urinary tract infection    hx of  . Urosepsis    history of    Review of Systems:   Review of Systems  Constitutional: Negative for fever.  HENT: Positive for ear pain and hearing loss. Negative for ear discharge.   Respiratory: Negative for shortness of breath.   Cardiovascular: Negative for leg swelling.  Gastrointestinal:       Incontinence  Genitourinary:       Incontinence  Musculoskeletal: Negative for falls.  Skin: Negative for rash.  Neurological: Negative for speech change and headaches.  Psychiatric/Behavioral: Positive for memory loss.      Physical Exam:  Vitals:   01/02/16 1620  BP: (!) 139/114  Pulse: 77  Temp: 98.8 F (37.1 C)  TempSrc: Oral  SpO2: 100%  Weight: 170 lb 3.2 oz (77.2 kg)  Height: 6\' 5"  (1.956 m)   GENERAL- pleasant, sitting comfortably in wheelchair, NAD HEENT- Blind, oral mucosa appears moist, poor dentition-few teeth intact on lower left CARDIAC- RRR, no murmurs, rubs  or gallops. RESP- CTAB, no wheezes or crackles. ABDOMEN- Soft, nontender, no guarding or rebound NEURO- 5/5 upper and lower extremity strength b/l EXTREMITIES- Left foot externally rotated, trace pitting edema on lower half of shins SKIN- Warm, dry, No rash or lesion.  PSYCH- Poor memory recall, appropriate mood and speech Administered limited mental status exam today due to blindness. He was unable to recall 3 items, unable to spell backwards, unable to count backwards. This would indicate moderate impairment assuming correct on all visual cue tasks were they possible.  Assessment & Plan:   See Encounters Tab for problem based charting.  Patient discussed with Dr. Criselda PeachesMullen

## 2016-01-02 NOTE — Patient Instructions (Signed)
It was a pleasure to see you both today.  There is a small perforation in the left ear drum. This should heal over the next few weeks without specific treatment but avoid submerging your head in water for any prolonged periods of time.  I do not recommend any changes in medication right now.  I will talk with the office staff to schedule a Medicare Advanced Care Planning Visit sometime in the next few months. They will call to arrange the details of this visit.

## 2016-01-07 DIAGNOSIS — H7292 Unspecified perforation of tympanic membrane, left ear: Secondary | ICD-10-CM | POA: Insufficient documentation

## 2016-01-07 NOTE — Assessment & Plan Note (Signed)
Flu shot given today

## 2016-01-07 NOTE — Assessment & Plan Note (Signed)
There is no major change since earlier this year. He may have slightly increased frequency but was also restarted a diuretic for his mildly uncontrolled hypertension. See problem above discussion of dementia.

## 2016-01-07 NOTE — Assessment & Plan Note (Signed)
What appears to be a healing perforation of the left TM plus some traumatic irritation of the canal 2/2 him instrumenting this with a radio antenna. I am not sure what underlying problem is making him want to clean his ears repeatedly as there was no significant cerumen accumulation.He has intact if somewhat decreased hearing in the left ear with no detachment.  -I see no acute reason for a procedural intervention at this time. -Avoid eardrops for now but might benefit in the future if he has some complaints to make him less likely to traumatize himself again

## 2016-01-07 NOTE — Assessment & Plan Note (Signed)
He has pretty intact strength on examination still with foot rotation and coordination difficulties. I am increasingly suspicious of a component of vascular dementia in his worsening functional status and incontinence over the past year. Informal mental status screening showed at least moderate deficits even without a test formally modified for his blindness. He and sister remain resistant to statin therapy as secondary prevention although I have never seen a documented contraindication. I broached the topic of advanced care planning today and he seemed to indicate he would not want aggressive medical interventions in the future but has not made a specific plan.  -Continue ASA -Will request to try and arrange advanced care planning visit in upcoming month or two

## 2016-01-16 NOTE — Progress Notes (Signed)
Internal Medicine Clinic Attending  Case discussed with Dr. Dimple Caseyice soon after the resident saw the patient.  We reviewed the resident's history and exam and pertinent patient test results.  I agree with the assessment, diagnosis, and plan of care documented in the resident's note.  Though noted to have a problem "dementia" I think Dr. Dimple Caseyice meant to refer to "Cerebral artery occlusion with cerebral infarction" when referencing such in his discussion about incontinence.

## 2016-02-06 ENCOUNTER — Other Ambulatory Visit: Payer: Self-pay | Admitting: Internal Medicine

## 2016-02-06 DIAGNOSIS — R066 Hiccough: Secondary | ICD-10-CM

## 2016-02-09 ENCOUNTER — Telehealth: Payer: Self-pay

## 2016-02-09 DIAGNOSIS — R066 Hiccough: Secondary | ICD-10-CM

## 2016-02-09 NOTE — Telephone Encounter (Signed)
Needs to speak with a nurse regarding chlorproMAZINE (THORAZINE) 25 MG.

## 2016-02-09 NOTE — Telephone Encounter (Signed)
I am not sure that he really still needs this medication. Hiccups have been well controlled for him for a long time. I will see him again in about 1 month and if the problem is worsened we can address it there.

## 2016-02-10 MED ORDER — CHLORPROMAZINE HCL 25 MG PO TABS: 25.0000 mg | ORAL_TABLET | Freq: Two times a day (BID) | ORAL | 0 refills | Status: DC

## 2016-02-10 NOTE — Telephone Encounter (Signed)
Pt's sister states pt has hiccups when thorazine is late or not given which leads to "stroke"- I think she may have meant seizures. Can you fill this til his next visit, she states she cannot get him before the next scheduled appt. Please advise

## 2016-03-12 ENCOUNTER — Ambulatory Visit: Payer: Medicare Other | Admitting: Dietician

## 2016-03-12 ENCOUNTER — Ambulatory Visit: Payer: Medicare Other | Admitting: Internal Medicine

## 2016-03-25 ENCOUNTER — Ambulatory Visit (INDEPENDENT_AMBULATORY_CARE_PROVIDER_SITE_OTHER): Payer: Medicare Other | Admitting: Internal Medicine

## 2016-03-25 VITALS — BP 132/87 | HR 78 | Temp 98.0°F | Ht 77.0 in | Wt 171.1 lb

## 2016-03-25 DIAGNOSIS — Z87891 Personal history of nicotine dependence: Secondary | ICD-10-CM | POA: Diagnosis not present

## 2016-03-25 DIAGNOSIS — H547 Unspecified visual loss: Secondary | ICD-10-CM

## 2016-03-25 DIAGNOSIS — R066 Hiccough: Secondary | ICD-10-CM

## 2016-03-25 DIAGNOSIS — Z79899 Other long term (current) drug therapy: Secondary | ICD-10-CM | POA: Diagnosis not present

## 2016-03-25 DIAGNOSIS — I10 Essential (primary) hypertension: Secondary | ICD-10-CM | POA: Diagnosis not present

## 2016-03-25 DIAGNOSIS — I69398 Other sequelae of cerebral infarction: Secondary | ICD-10-CM | POA: Diagnosis not present

## 2016-03-25 MED ORDER — CHLORPROMAZINE HCL 25 MG PO TABS: 25.0000 mg | ORAL_TABLET | Freq: Two times a day (BID) | ORAL | 0 refills | Status: DC

## 2016-03-25 MED ORDER — TRIAMTERENE-HCTZ 37.5-25 MG PO TABS: 1.0000 | ORAL_TABLET | Freq: Every day | ORAL | 2 refills | Status: DC

## 2016-03-25 NOTE — Progress Notes (Signed)
Case discussed with Dr. Ahmed at the time of the visit. We reviewed the resident's history and exam and pertinent patient test results. I agree with the assessment, diagnosis, and plan of care documented in the resident's note. 

## 2016-03-25 NOTE — Progress Notes (Signed)
   CC: med refill and f/up  HPI:  Mr.Christoph Mayford KnifeWilliams is a 76 y.o. with PMH listed below is here for medication refill and follow up on HTN  Past Medical History:  Diagnosis Date  . BPH (benign prostatic hypertrophy)   . CVA (cerebral infarction)   . Glaucoma   . Hiccups   . Hypertension   . Hypokalemia   . HYPOMAGNESEMIA 03/31/2006  . Lipoma    pedunculated on left belt line  . Renal insufficiency    Baseline Cr 1.5  . Right inguinal hernia 07/13/2011   Status post open repair 08/19/11   . Stroke (HCC)   . Urinary tract infection    hx of  . Urosepsis    history of   States that his PCP Dr. Dimple Caseyice wanted to bring him in for GOC/advanced directive conversation but they did not feel that was needed as they already know what to do. Refill request was denied previously until patient was seen. They need refill on maxzide and thorazine.   HTN - on maxzide 37.5-25mg  daily + quinapril 40mg  daily + metoprolol 100mg  daily + amlodipine 10mg  daily. BP well controlled today on this. Needs refill on maxzide  Hiccups - chronic since stroke. On thorazine 25mg  bid, starts having hiccup as soon as he stops the dose. Wants refill. Has been on this chronically for last few years.   Doing well, no complaints.  Review of Systems:  Review of Systems  Constitutional: Negative for chills and fever.  Cardiovascular: Negative for chest pain.  Gastrointestinal: Negative for heartburn, nausea and vomiting.  Neurological: Negative for dizziness and headaches.     Physical Exam:  Vitals:   03/25/16 1436  BP: 132/87  Pulse: 78  Temp: 98 F (36.7 C)  TempSrc: Oral  SpO2: 100%  Weight: 171 lb 1.6 oz (77.6 kg)  Height: 6\' 5"  (1.956 m)   Physical Exam  Constitutional: He is oriented to person, place, and time. He appears well-developed and well-nourished. No distress.  Sitting in wheel chair, blind in both eyes.  Eyes: Conjunctivae are normal. Pupils are equal, round, and reactive to light.    Cardiovascular: Normal rate and regular rhythm.  Exam reveals no gallop and no friction rub.   No murmur heard. Respiratory: Effort normal and breath sounds normal. No respiratory distress. He has no wheezes.  GI: Soft. Bowel sounds are normal. He exhibits no distension. There is no tenderness.  Musculoskeletal: Normal range of motion.  1+ pitting edema b/l upto the shins.  Neurological: He is alert and oriented to person, place, and time. No cranial nerve deficit.  Skin: He is not diaphoretic.    Assessment & Plan:   See Encounters Tab for problem based charting.  Patient discussed with Dr. Josem KaufmannKlima

## 2016-03-25 NOTE — Patient Instructions (Signed)
Filled your meds.  Follow up with his regular doctor in 3 months.

## 2016-05-03 LAB — IFOBT (OCCULT BLOOD): IFOBT: NEGATIVE

## 2016-06-03 ENCOUNTER — Other Ambulatory Visit: Payer: Self-pay | Admitting: Internal Medicine

## 2016-06-03 DIAGNOSIS — R066 Hiccough: Secondary | ICD-10-CM

## 2016-06-04 ENCOUNTER — Other Ambulatory Visit: Payer: Self-pay | Admitting: Internal Medicine

## 2016-06-04 DIAGNOSIS — R066 Hiccough: Secondary | ICD-10-CM

## 2016-06-04 MED ORDER — CHLORPROMAZINE HCL 25 MG PO TABS: 25.0000 mg | ORAL_TABLET | Freq: Two times a day (BID) | ORAL | 0 refills | Status: DC

## 2016-06-04 NOTE — Telephone Encounter (Signed)
[facility]INE 25MG [facility] [address]

## 2016-07-05 ENCOUNTER — Other Ambulatory Visit: Payer: Self-pay | Admitting: Pulmonary Disease

## 2016-07-05 ENCOUNTER — Other Ambulatory Visit: Payer: Self-pay | Admitting: Internal Medicine

## 2016-07-05 DIAGNOSIS — I10 Essential (primary) hypertension: Secondary | ICD-10-CM

## 2016-08-04 ENCOUNTER — Other Ambulatory Visit: Payer: Self-pay

## 2016-08-04 NOTE — Telephone Encounter (Signed)
Called pt - no answer;left message he has 1 refill left on Thorazine, to call his pharmacy and if has any problems to give us a call back.

## 2016-08-04 NOTE — Telephone Encounter (Signed)
chlorproMAZINE (THORAZINE) 25 MG tablet, refill request @ [facility] [address].

## 2016-10-03 ENCOUNTER — Other Ambulatory Visit: Payer: Self-pay | Admitting: Internal Medicine

## 2016-10-03 DIAGNOSIS — I10 Essential (primary) hypertension: Secondary | ICD-10-CM

## 2016-10-15 ENCOUNTER — Encounter: Payer: Medicare Other | Admitting: Internal Medicine

## 2016-11-12 ENCOUNTER — Encounter: Payer: Self-pay | Admitting: Internal Medicine

## 2016-11-12 ENCOUNTER — Encounter (INDEPENDENT_AMBULATORY_CARE_PROVIDER_SITE_OTHER): Payer: Self-pay

## 2016-11-12 ENCOUNTER — Ambulatory Visit (INDEPENDENT_AMBULATORY_CARE_PROVIDER_SITE_OTHER): Payer: Medicare Other | Admitting: Internal Medicine

## 2016-11-12 VITALS — BP 102/73 | HR 72 | Temp 98.3°F | Ht 73.0 in | Wt 166.7 lb

## 2016-11-12 DIAGNOSIS — H547 Unspecified visual loss: Secondary | ICD-10-CM

## 2016-11-12 DIAGNOSIS — N183 Chronic kidney disease, stage 3 unspecified: Secondary | ICD-10-CM

## 2016-11-12 DIAGNOSIS — Z23 Encounter for immunization: Secondary | ICD-10-CM

## 2016-11-12 DIAGNOSIS — Z79899 Other long term (current) drug therapy: Secondary | ICD-10-CM

## 2016-11-12 DIAGNOSIS — H9193 Unspecified hearing loss, bilateral: Secondary | ICD-10-CM | POA: Diagnosis not present

## 2016-11-12 DIAGNOSIS — Z8673 Personal history of transient ischemic attack (TIA), and cerebral infarction without residual deficits: Secondary | ICD-10-CM

## 2016-11-12 DIAGNOSIS — I129 Hypertensive chronic kidney disease with stage 1 through stage 4 chronic kidney disease, or unspecified chronic kidney disease: Secondary | ICD-10-CM | POA: Diagnosis not present

## 2016-11-12 DIAGNOSIS — H7292 Unspecified perforation of tympanic membrane, left ear: Secondary | ICD-10-CM | POA: Diagnosis not present

## 2016-11-12 DIAGNOSIS — I10 Essential (primary) hypertension: Secondary | ICD-10-CM

## 2016-11-12 DIAGNOSIS — Z0000 Encounter for general adult medical examination without abnormal findings: Secondary | ICD-10-CM

## 2016-11-12 NOTE — Progress Notes (Signed)
   CC: Follow up for hypertension  HPI:  Austin Baker is a 76 y.o. male with PMHx detailed below presenting for a routine follow visit to check his hypertension and medication refills.  See problem based assessment and plan below for additional details.  Essential hypertension HPI: He is taking medications very consistently and blood pressure is at goal at home. His sister is a CNA and assists him daily. Blood pressure is down to 100s/70s today but he denies any episodes of lightheadedness or worsened fatigue.  A: Hypertension, very tightly controlled under 130/90 today. His goal is for tight control due to history of hypertension related stroke decades ago. P: BMP today Continue amlodipine, metoprolol, quinapril, HCTZ today Low threshold to discontinue one of his medications if he has symptoms or BP goes lower in the future  Tympanic membrane perforation, left Ears are normal on examination today. Hearing is grossly diminished bilaterally but symmetric.  Preventative health care Flu shot given today  CKD (chronic kidney disease) stage 3, GFR 30-59 ml/min (HCC) BMP is checked again today does indicate eGFR in CKD3 range.    Past Medical History:  Diagnosis Date  . BPH (benign prostatic hypertrophy)   . CVA (cerebral infarction)   . Glaucoma   . Hiccups   . Hypertension   . Hypokalemia   . HYPOMAGNESEMIA 03/31/2006  . Lipoma    pedunculated on left belt line  . Renal insufficiency    Baseline Cr 1.5  . Right inguinal hernia 07/13/2011   Status post open repair 08/19/11   . Stroke ([place])   . Urinary tract infection    hx of  . Urosepsis    history of    Review of Systems: Review of Systems  Constitutional: Negative for chills, fever and weight loss.  HENT: Negative for hearing loss.   Respiratory: Negative for shortness of breath.   Cardiovascular: Negative for chest pain and leg swelling.  Gastrointestinal: Negative for constipation and diarrhea.    Genitourinary: Negative for dysuria.  Musculoskeletal: Negative for falls and joint pain.  Neurological: Negative for dizziness and headaches.     Physical Exam: Vitals:   11/12/16 1540  BP: 102/73  Pulse: 72  Temp: 98.3 F (36.8 C)  TempSrc: Oral  SpO2: 100%  Weight: 166 lb 11.2 oz (75.6 kg)  Height: '6\' 1"'$  (1.854 m)   GENERAL- alert, co-operative, NAD HEENT- Blind, TMs intact bilaterally CARDIAC- RRR, no murmurs, rubs or gallops. RESP- CTAB, no wheezes or crackles. ABDOMEN- Soft, nontender, no guarding or rebound NEURO- Strength upper and lower extremities- 5/5, Sensation intact globally EXTREMITIES- Symmetric, trace left pedal edema SKIN- Warm, dry, No rash or lesion. PSYCH- Slow responses, impaired recall, he is oriented and answers are appropriate    Assessment & Plan:   See encounters tab for problem based medical decision making.   Patient discussed with Dr. Rebeca Alert

## 2016-11-12 NOTE — Patient Instructions (Signed)
It was a pleasure to see you today Austin Baker.  You are doing a wonderful job taking your blood pressure medicines and staying strong and mobile at home. Your family is also doing a great job supporting you. We are checking some routine blood tests today just to make sure no changes are needed in your medicines.  We can see you again in 6 months or sooner if you need us for any reason.

## 2016-11-13 LAB — BMP8+ANION GAP
Anion Gap: 16 mmol/L (ref 10.0–18.0)
BUN/Creatinine Ratio: 17 (ref 10–24)
BUN: 27 mg/dL (ref 8–27)
CO2: 22 mmol/L (ref 20–29)
Calcium: 9.9 mg/dL (ref 8.6–10.2)
Chloride: 99 mmol/L (ref 96–106)
Creatinine, Ser: 1.57 mg/dL — ABNORMAL HIGH (ref 0.76–1.27)
GFR calc Af Amer: 49 mL/min/{1.73_m2} — ABNORMAL LOW (ref >59)
GFR calc non Af Amer: 43 mL/min/{1.73_m2} — ABNORMAL LOW (ref >59)
Glucose: 91 mg/dL (ref 65–99)
Potassium: 3.7 mmol/L (ref 3.5–5.2)
Sodium: 137 mmol/L (ref 134–144)

## 2016-11-13 NOTE — Assessment & Plan Note (Signed)
HPI: He is taking medications very consistently and blood pressure is at goal at home. His sister is a CNA and assists him daily. Blood pressure is down to 100s/70s today but he denies any episodes of lightheadedness or worsened fatigue.  A: Hypertension, very tightly controlled under 130/90 today. His goal is for tight control due to history of hypertension related stroke decades ago. P: BMP today Continue amlodipine, metoprolol, quinapril, HCTZ today Low threshold to discontinue one of his medications if he has symptoms or BP goes lower in the future

## 2016-11-13 NOTE — Assessment & Plan Note (Signed)
Flu shot given today

## 2016-11-13 NOTE — Assessment & Plan Note (Signed)
BMP is checked again today does indicate eGFR in CKD3 range.

## 2016-11-13 NOTE — Assessment & Plan Note (Signed)
Ears are normal on examination today. Hearing is grossly diminished bilaterally but symmetric.

## 2016-11-15 NOTE — Progress Notes (Signed)
Internal Medicine Clinic Attending  Case discussed with Dr. Rice  at the time of the visit.  We reviewed the resident's history and exam and pertinent patient test results.  I agree with the assessment, diagnosis, and plan of care documented in the resident's note.  Alexander N Raines, MD   

## 2016-11-29 ENCOUNTER — Other Ambulatory Visit: Payer: Self-pay | Admitting: Internal Medicine

## 2016-11-29 DIAGNOSIS — I10 Essential (primary) hypertension: Secondary | ICD-10-CM

## 2016-12-29 ENCOUNTER — Other Ambulatory Visit: Payer: Self-pay | Admitting: *Deleted

## 2016-12-29 DIAGNOSIS — R066 Hiccough: Secondary | ICD-10-CM

## 2016-12-29 MED ORDER — CHLORPROMAZINE HCL 25 MG PO TABS: 25.0000 mg | ORAL_TABLET | Freq: Two times a day (BID) | ORAL | 2 refills | Status: AC

## 2016-12-29 MED ORDER — TRIAMTERENE-HCTZ 37.5-25 MG PO TABS: 1.0000 | ORAL_TABLET | Freq: Every day | ORAL | 2 refills | Status: AC

## 2017-05-25 DIAGNOSIS — I10 Essential (primary) hypertension: Secondary | ICD-10-CM | POA: Diagnosis not present

## 2017-05-25 DIAGNOSIS — R066 Hiccough: Secondary | ICD-10-CM | POA: Diagnosis not present

## 2017-05-25 DIAGNOSIS — M6281 Muscle weakness (generalized): Secondary | ICD-10-CM | POA: Diagnosis not present

## 2017-05-25 DIAGNOSIS — Z0000 Encounter for general adult medical examination without abnormal findings: Secondary | ICD-10-CM | POA: Diagnosis not present

## 2017-05-25 DIAGNOSIS — I6350 Cerebral infarction due to unspecified occlusion or stenosis of unspecified cerebral artery: Secondary | ICD-10-CM | POA: Diagnosis not present

## 2017-06-02 DIAGNOSIS — I10 Essential (primary) hypertension: Secondary | ICD-10-CM | POA: Diagnosis not present

## 2017-06-02 DIAGNOSIS — I639 Cerebral infarction, unspecified: Secondary | ICD-10-CM | POA: Diagnosis not present

## 2017-06-22 DIAGNOSIS — M2011 Hallux valgus (acquired), right foot: Secondary | ICD-10-CM | POA: Diagnosis not present

## 2017-06-22 DIAGNOSIS — B351 Tinea unguium: Secondary | ICD-10-CM | POA: Diagnosis not present

## 2017-06-27 ENCOUNTER — Other Ambulatory Visit: Payer: Self-pay | Admitting: Internal Medicine

## 2017-06-27 DIAGNOSIS — I10 Essential (primary) hypertension: Secondary | ICD-10-CM

## 2017-07-13 DIAGNOSIS — R066 Hiccough: Secondary | ICD-10-CM | POA: Diagnosis not present

## 2017-07-13 DIAGNOSIS — Z7902 Long term (current) use of antithrombotics/antiplatelets: Secondary | ICD-10-CM | POA: Diagnosis not present

## 2017-07-13 DIAGNOSIS — I6350 Cerebral infarction due to unspecified occlusion or stenosis of unspecified cerebral artery: Secondary | ICD-10-CM | POA: Diagnosis not present

## 2017-07-13 DIAGNOSIS — I10 Essential (primary) hypertension: Secondary | ICD-10-CM | POA: Diagnosis not present

## 2017-08-03 ENCOUNTER — Encounter: Payer: Self-pay | Admitting: *Deleted

## 2017-08-29 DIAGNOSIS — Z7902 Long term (current) use of antithrombotics/antiplatelets: Secondary | ICD-10-CM | POA: Diagnosis not present

## 2017-08-29 DIAGNOSIS — M6281 Muscle weakness (generalized): Secondary | ICD-10-CM | POA: Diagnosis not present

## 2017-08-29 DIAGNOSIS — I10 Essential (primary) hypertension: Secondary | ICD-10-CM | POA: Diagnosis not present

## 2017-08-29 DIAGNOSIS — R066 Hiccough: Secondary | ICD-10-CM | POA: Diagnosis not present

## 2017-08-29 DIAGNOSIS — I6350 Cerebral infarction due to unspecified occlusion or stenosis of unspecified cerebral artery: Secondary | ICD-10-CM | POA: Diagnosis not present

## 2017-10-13 ENCOUNTER — Other Ambulatory Visit: Payer: Self-pay | Admitting: Internal Medicine

## 2017-10-13 DIAGNOSIS — R066 Hiccough: Secondary | ICD-10-CM

## 2017-10-14 NOTE — Telephone Encounter (Signed)
Thanks Glenda, I will also try and contact him to make sure he requires his medications but I will like to see him at the clinic before moving forward.

## 2017-10-14 NOTE — Telephone Encounter (Signed)
Called pt - no answer; left message to call the office to schedule an appt. 

## 2017-10-17 NOTE — Telephone Encounter (Signed)
Patient will not be able to see Dr. Dortha Schwalbe until December.  His schedule is currently not available.

## 2017-10-19 NOTE — Telephone Encounter (Signed)
Thanks for the update. I will call the patient and follow up. Her BP meds on 11/2016 are quite different from the requested refill.   Her BP was also at goal but slightly on the lower side.

## 2017-10-22 DIAGNOSIS — Z7902 Long term (current) use of antithrombotics/antiplatelets: Secondary | ICD-10-CM | POA: Diagnosis not present

## 2017-10-22 DIAGNOSIS — Z8673 Personal history of transient ischemic attack (TIA), and cerebral infarction without residual deficits: Secondary | ICD-10-CM | POA: Diagnosis not present

## 2017-10-22 DIAGNOSIS — M6281 Muscle weakness (generalized): Secondary | ICD-10-CM | POA: Diagnosis not present

## 2017-10-22 DIAGNOSIS — I10 Essential (primary) hypertension: Secondary | ICD-10-CM | POA: Diagnosis not present

## 2017-10-22 DIAGNOSIS — H409 Unspecified glaucoma: Secondary | ICD-10-CM | POA: Diagnosis not present

## 2017-11-17 ENCOUNTER — Other Ambulatory Visit: Payer: Self-pay | Admitting: *Deleted

## 2017-11-17 DIAGNOSIS — I10 Essential (primary) hypertension: Secondary | ICD-10-CM

## 2017-11-17 NOTE — Telephone Encounter (Signed)
Next appt scheduled  12/6 with PCP. 

## 2017-11-18 MED ORDER — AMLODIPINE BESYLATE 10 MG PO TABS
10.0000 mg | ORAL_TABLET | Freq: Every day | ORAL | 3 refills | Status: AC
Start: 2017-11-18 — End: ?

## 2017-11-20 ENCOUNTER — Inpatient Hospital Stay (HOSPITAL_COMMUNITY)
Admission: EM | Admit: 2017-11-20 | Discharge: 2017-11-25 | DRG: 948 | Disposition: A | Discharge status: Skilled Nursing Facility | Payer: Medicare Other | Attending: Oncology | Admitting: Oncology

## 2017-11-20 ENCOUNTER — Other Ambulatory Visit: Payer: Self-pay

## 2017-11-20 ENCOUNTER — Emergency Department (HOSPITAL_COMMUNITY): Payer: Medicare Other

## 2017-11-20 ENCOUNTER — Encounter (HOSPITAL_COMMUNITY): Payer: Self-pay | Admitting: *Deleted

## 2017-11-20 DIAGNOSIS — Z79899 Other long term (current) drug therapy: Secondary | ICD-10-CM | POA: Diagnosis not present

## 2017-11-20 DIAGNOSIS — D631 Anemia in chronic kidney disease: Secondary | ICD-10-CM | POA: Diagnosis present

## 2017-11-20 DIAGNOSIS — I69341 Monoplegia of lower limb following cerebral infarction affecting right dominant side: Secondary | ICD-10-CM | POA: Diagnosis not present

## 2017-11-20 DIAGNOSIS — Z7982 Long term (current) use of aspirin: Secondary | ICD-10-CM

## 2017-11-20 DIAGNOSIS — R2681 Unsteadiness on feet: Secondary | ICD-10-CM | POA: Diagnosis not present

## 2017-11-20 DIAGNOSIS — Z8249 Family history of ischemic heart disease and other diseases of the circulatory system: Secondary | ICD-10-CM

## 2017-11-20 DIAGNOSIS — Z87440 Personal history of urinary (tract) infections: Secondary | ICD-10-CM

## 2017-11-20 DIAGNOSIS — H548 Legal blindness, as defined in USA: Secondary | ICD-10-CM | POA: Diagnosis present

## 2017-11-20 DIAGNOSIS — R4781 Slurred speech: Secondary | ICD-10-CM | POA: Diagnosis not present

## 2017-11-20 DIAGNOSIS — H409 Unspecified glaucoma: Secondary | ICD-10-CM | POA: Diagnosis present

## 2017-11-20 DIAGNOSIS — H547 Unspecified visual loss: Secondary | ICD-10-CM | POA: Diagnosis not present

## 2017-11-20 DIAGNOSIS — R509 Fever, unspecified: Secondary | ICD-10-CM | POA: Diagnosis not present

## 2017-11-20 DIAGNOSIS — I69391 Dysphagia following cerebral infarction: Secondary | ICD-10-CM | POA: Diagnosis not present

## 2017-11-20 DIAGNOSIS — E876 Hypokalemia: Secondary | ICD-10-CM | POA: Diagnosis present

## 2017-11-20 DIAGNOSIS — I69398 Other sequelae of cerebral infarction: Secondary | ICD-10-CM | POA: Diagnosis not present

## 2017-11-20 DIAGNOSIS — R531 Weakness: Secondary | ICD-10-CM | POA: Diagnosis not present

## 2017-11-20 DIAGNOSIS — R2689 Other abnormalities of gait and mobility: Secondary | ICD-10-CM | POA: Diagnosis not present

## 2017-11-20 DIAGNOSIS — I10 Essential (primary) hypertension: Secondary | ICD-10-CM | POA: Diagnosis not present

## 2017-11-20 DIAGNOSIS — I129 Hypertensive chronic kidney disease with stage 1 through stage 4 chronic kidney disease, or unspecified chronic kidney disease: Secondary | ICD-10-CM | POA: Diagnosis present

## 2017-11-20 DIAGNOSIS — N3000 Acute cystitis without hematuria: Secondary | ICD-10-CM

## 2017-11-20 DIAGNOSIS — I491 Atrial premature depolarization: Secondary | ICD-10-CM | POA: Diagnosis not present

## 2017-11-20 DIAGNOSIS — R8271 Bacteriuria: Secondary | ICD-10-CM | POA: Diagnosis not present

## 2017-11-20 DIAGNOSIS — R5381 Other malaise: Secondary | ICD-10-CM | POA: Diagnosis present

## 2017-11-20 DIAGNOSIS — M2550 Pain in unspecified joint: Secondary | ICD-10-CM | POA: Diagnosis not present

## 2017-11-20 DIAGNOSIS — R1311 Dysphagia, oral phase: Secondary | ICD-10-CM | POA: Diagnosis not present

## 2017-11-20 DIAGNOSIS — N183 Chronic kidney disease, stage 3 (moderate): Secondary | ICD-10-CM | POA: Diagnosis not present

## 2017-11-20 DIAGNOSIS — K0889 Other specified disorders of teeth and supporting structures: Secondary | ICD-10-CM | POA: Diagnosis not present

## 2017-11-20 DIAGNOSIS — D649 Anemia, unspecified: Secondary | ICD-10-CM | POA: Diagnosis not present

## 2017-11-20 DIAGNOSIS — R066 Hiccough: Secondary | ICD-10-CM | POA: Diagnosis present

## 2017-11-20 DIAGNOSIS — R000 Tachycardia, unspecified: Secondary | ICD-10-CM | POA: Diagnosis not present

## 2017-11-20 DIAGNOSIS — I69351 Hemiplegia and hemiparesis following cerebral infarction affecting right dominant side: Secondary | ICD-10-CM | POA: Diagnosis not present

## 2017-11-20 DIAGNOSIS — Z87891 Personal history of nicotine dependence: Secondary | ICD-10-CM | POA: Diagnosis not present

## 2017-11-20 DIAGNOSIS — Z7401 Bed confinement status: Secondary | ICD-10-CM | POA: Diagnosis not present

## 2017-11-20 DIAGNOSIS — G9389 Other specified disorders of brain: Secondary | ICD-10-CM | POA: Diagnosis not present

## 2017-11-20 DIAGNOSIS — N400 Enlarged prostate without lower urinary tract symptoms: Secondary | ICD-10-CM | POA: Diagnosis present

## 2017-11-20 DIAGNOSIS — M6281 Muscle weakness (generalized): Secondary | ICD-10-CM | POA: Diagnosis not present

## 2017-11-20 HISTORY — DX: Legal blindness, as defined in USA: H54.8

## 2017-11-20 HISTORY — DX: Cerebral infarction due to unspecified occlusion or stenosis of unspecified cerebral artery: I63.50

## 2017-11-20 LAB — CBC WITH DIFFERENTIAL/PLATELET
ABS IMMATURE GRANULOCYTES: 0.04 10*3/uL (ref 0.00–0.07)
BASOS ABS: 0 10*3/uL (ref 0.0–0.1)
BASOS PCT: 0 %
Eosinophils Absolute: 0.2 10*3/uL (ref 0.0–0.5)
Eosinophils Relative: 2 %
HCT: 33.9 % — ABNORMAL LOW (ref 39.0–52.0)
Hemoglobin: 11.2 g/dL — ABNORMAL LOW (ref 13.0–17.0)
IMMATURE GRANULOCYTES: 0 %
LYMPHS ABS: 1.5 10*3/uL (ref 0.7–4.0)
Lymphocytes Relative: 15 %
MCH: 27.9 pg (ref 26.0–34.0)
MCHC: 33 g/dL (ref 30.0–36.0)
MCV: 84.5 fL (ref 80.0–100.0)
MONOS PCT: 11 %
Monocytes Absolute: 1.1 10*3/uL — ABNORMAL HIGH (ref 0.1–1.0)
NEUTROS ABS: 7 10*3/uL (ref 1.7–7.7)
NEUTROS PCT: 72 %
NRBC: 0 % (ref 0.0–0.2)
Platelets: 321 10*3/uL (ref 150–400)
RBC: 4.01 MIL/uL — ABNORMAL LOW (ref 4.22–5.81)
RDW: 14 % (ref 11.5–15.5)
WBC: 9.8 10*3/uL (ref 4.0–10.5)

## 2017-11-20 LAB — URINALYSIS, ROUTINE W REFLEX MICROSCOPIC
Bilirubin Urine: NEGATIVE
GLUCOSE, UA: NEGATIVE mg/dL
Hgb urine dipstick: NEGATIVE
Ketones, ur: NEGATIVE mg/dL
Nitrite: NEGATIVE
PH: 7 (ref 5.0–8.0)
Protein, ur: NEGATIVE mg/dL
Specific Gravity, Urine: 1.01 (ref 1.005–1.030)

## 2017-11-20 LAB — COMPREHENSIVE METABOLIC PANEL
ALBUMIN: 3.1 g/dL — AB (ref 3.5–5.0)
ALT: 13 U/L (ref 0–44)
AST: 27 U/L (ref 15–41)
Alkaline Phosphatase: 73 U/L (ref 38–126)
Anion gap: 10 (ref 5–15)
BUN: 21 mg/dL (ref 8–23)
CHLORIDE: 103 mmol/L (ref 98–111)
CO2: 24 mmol/L (ref 22–32)
Calcium: 9.1 mg/dL (ref 8.9–10.3)
Creatinine, Ser: 1.49 mg/dL — ABNORMAL HIGH (ref 0.61–1.24)
GFR calc Af Amer: 51 mL/min — ABNORMAL LOW (ref >60)
GFR calc non Af Amer: 44 mL/min — ABNORMAL LOW (ref >60)
GLUCOSE: 87 mg/dL (ref 70–99)
POTASSIUM: 3.2 mmol/L — AB (ref 3.5–5.1)
SODIUM: 137 mmol/L (ref 135–145)
Total Bilirubin: 0.7 mg/dL (ref 0.3–1.2)
Total Protein: 7.2 g/dL (ref 6.5–8.1)

## 2017-11-20 LAB — I-STAT CG4 LACTIC ACID, ED: Lactic Acid, Venous: 1.72 mmol/L (ref 0.5–1.9)

## 2017-11-20 LAB — TROPONIN I

## 2017-11-20 MED ORDER — CHLORPROMAZINE HCL 25 MG PO TABS
25.0000 mg | ORAL_TABLET | Freq: Two times a day (BID) | ORAL | Status: DC
  Administered 2017-11-21 – 2017-11-25 (×9): 25 mg via ORAL
  Filled 2017-11-20 (×10): qty 1

## 2017-11-20 MED ORDER — POLYETHYLENE GLYCOL 3350 17 G PO PACK: 17.0000 g | PACK | Freq: Every day | ORAL | Status: DC | PRN

## 2017-11-20 MED ORDER — AMLODIPINE BESYLATE 10 MG PO TABS
10.0000 mg | ORAL_TABLET | Freq: Every day | ORAL | Status: DC
  Administered 2017-11-21 – 2017-11-25 (×5): 10 mg via ORAL
  Filled 2017-11-20 (×5): qty 1

## 2017-11-20 MED ORDER — ENOXAPARIN SODIUM 40 MG/0.4ML ~~LOC~~ SOLN
40.0000 mg | Freq: Every day | SUBCUTANEOUS | Status: DC
  Administered 2017-11-21 – 2017-11-25 (×5): 40 mg via SUBCUTANEOUS
  Filled 2017-11-20 (×5): qty 0.4

## 2017-11-20 MED ORDER — ASPIRIN 81 MG PO TABS: 81.0000 mg | ORAL_TABLET | Freq: Every day | ORAL | Status: DC

## 2017-11-20 MED ORDER — SODIUM CHLORIDE 0.9 % IV BOLUS
1000.0000 mL | Freq: Once | INTRAVENOUS | Status: AC
  Administered 2017-11-20: 1000 mL via INTRAVENOUS

## 2017-11-20 MED ORDER — ACETAMINOPHEN 325 MG PO TABS: 650.0000 mg | ORAL_TABLET | Freq: Four times a day (QID) | ORAL | Status: DC | PRN

## 2017-11-20 MED ORDER — METOPROLOL SUCCINATE ER 100 MG PO TB24
100.0000 mg | ORAL_TABLET | Freq: Every day | ORAL | Status: DC
  Administered 2017-11-21 – 2017-11-25 (×5): 100 mg via ORAL
  Filled 2017-11-20 (×5): qty 1

## 2017-11-20 MED ORDER — ACETAMINOPHEN 650 MG RE SUPP: 650.0000 mg | Freq: Four times a day (QID) | RECTAL | Status: DC | PRN

## 2017-11-20 MED ORDER — SODIUM CHLORIDE 0.9 % IV SOLN
INTRAVENOUS | Status: DC
  Administered 2017-11-20: 21:00:00 via INTRAVENOUS

## 2017-11-20 MED ORDER — SODIUM CHLORIDE 0.9 % IV SOLN
2.0000 g | Freq: Once | INTRAVENOUS | Status: AC
  Administered 2017-11-20: 2 g via INTRAVENOUS
  Filled 2017-11-20: qty 20

## 2017-11-20 MED ORDER — ASPIRIN 81 MG PO CHEW
81.0000 mg | CHEWABLE_TABLET | Freq: Every day | ORAL | Status: DC
  Administered 2017-11-21 – 2017-11-25 (×5): 81 mg via ORAL
  Filled 2017-11-20 (×5): qty 1

## 2017-11-20 MED ORDER — QUINAPRIL HCL 10 MG PO TABS
40.0000 mg | ORAL_TABLET | Freq: Every day | ORAL | Status: DC
  Administered 2017-11-21 – 2017-11-25 (×5): 40 mg via ORAL
  Filled 2017-11-20 (×5): qty 4

## 2017-11-20 NOTE — H&P (Addendum)
Date: 11/21/2017               Patient Name:  Austin Baker MRN: [id number]  DOB: March 05, 1940 Age / Sex: 77 y.o., male   PCP: Yvette Rack, MD         Medical Service: Internal Medicine Teaching Service         Attending Physician: Dr. Oswaldo Done     First Contact: Dr. Karilyn Cota  Pager: [phone]  Second Contact: Dr. 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   Austin Baker who is Austin caregiver reports that usually he ambulates with a cane at baseline however for the past 3 days he has required assistance with ambulation as he requires assistance using the bathroom and also walking to the dinner table.  Denies headaches, dizziness, lightheadedness, syncope, chest pain, palpitation, numbness, tingling, back pain, urinary frequency, urinary urgency, bowel incontinence, dysuria, recent upper respiratory symptoms, diarrhea or any recent GI symptoms.  He reports of good appetite however states he does not drink a lot of water.  During our assessment, patient did report that he felt better.  ED course: Vitals within normal limit, CBC with hemoglobin of 11.2, CMP with hypokalemia K+ 3.2, i-STAT troponin unremarkable, lactic acid unremarkable, urinalysis with trace leukocytes and many bacteria.  Received 1 L bolus of normal saline and started on ceftriaxone in ED.   Meds:  Current Meds  Medication Sig  . amLODipine (NORVASC) 10 MG tablet Take 1 tablet (10 mg total) by mouth daily.  Marland Kitchen aspirin 81 MG tablet Take 81 mg by mouth daily.  . chlorproMAZINE (THORAZINE) 25 MG  tablet Take 1 tablet (25 mg total) by mouth 2 (two) times daily.  . metoprolol succinate (TOPROL-XL) 100 MG 24 hr tablet TAKE 1 TABLET BY MOUTH ONCE DAILY TAKE  WITH  OR  IMMEDIATELY  FOLLOWING  A  MEAL (Patient taking differently: Take 100 mg by mouth daily. )  . NON FORMULARY Banana: Eat one banana with breakfast every day, per MD's recommendation  . quinapril (ACCUPRIL) 40 MG tablet TAKE ONE TABLET BY MOUTH ONCE DAILY (Patient taking differently: Take 40 mg by mouth daily. )  . triamterene-hydrochlorothiazide (MAXZIDE-25) 37.5-25 MG tablet Take 1 tablet by mouth daily.     Allergies: Allergies as of 11/20/2017  . (No Known Allergies)   Past Medical History:  Diagnosis Date  . BPH (benign prostatic hypertrophy)   . CVA (cerebral infarction)   . Glaucoma   . Hiccups   . Hypertension   . Hypokalemia   . HYPOMAGNESEMIA 03/31/2006  . Legally blind   . Lipoma    pedunculated on left belt line  . Renal insufficiency    Baseline Cr 1.5  . Right inguinal hernia 07/13/2011   Status post open repair 08/19/11   . Stroke (HCC)   . Urinary tract infection    hx of  . Urosepsis    history of    Family History: Mother with diabetes and hypertension.  Social History:  - Previous smoker, last cigarette use in 1970s. - Resides with Austin Baker who has been Austin caregiver since  60 - Originally from [place] but has been [place] for > 20 years. - Usually ambulates with cane - Denies EtOH, illicit drug use.  Review of Systems: A complete ROS was negative except as per HPI.   Physical Exam: Blood pressure 140/87, pulse 88, temperature 98.5 F (36.9 C), temperature source Oral, resp. rate 20, height 6\' 2"  (1.88 m), weight 68.9 kg, SpO2 100 %. Physical Exam  Constitutional: He is oriented to person, place, and time and well-developed, well-nourished, and in no distress. No distress.  HENT:  Head: Normocephalic and atraumatic.  Mouth/Throat: Oropharynx is clear and moist.  Poor  dentition Dry mucous membrane  Eyes: Conjunctivae and EOM are normal.  Neck: Normal range of motion. Neck supple.  Cardiovascular: Normal rate, regular rhythm and normal heart sounds. Exam reveals no gallop and no friction rub.  No murmur heard. Pulmonary/Chest: Effort normal and breath sounds normal. No respiratory distress. He has no wheezes. He has no rales.  Abdominal: Soft. Bowel sounds are normal. He exhibits no distension. There is no tenderness.  Musculoskeletal: Normal range of motion. He exhibits no edema, tenderness or deformity.  Neurological: He is alert and oriented to person, place, and time.  -Cranial nerves II to XII intact with no focal neurological deficits or sensory deficits. - Motor strength: 5/5 at all 4 extremities - No abnormal coordination.  Skin: He is not diaphoretic.    EKG: personally reviewed my interpretation is normal sinus rhythm  CXR: personally reviewed my interpretation is unremarkable  Assessment & Plan by Problem: Active Problems:   UTI (urinary tract infection)  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  Acute on chronic weakness: Patient has right lower extremity weakness at baseline from Austin previous CVAs but now presents with general weakness. Has required assistance with ambulation for the past 3 days.  He is compliant with all Austin medications and has no history of atrial fibrillation.  Stroke versus TIA is on the differential given Austin history of previous strokes however CT head showed no acute intracranial abnormalities.  In addition, Austin neurological exam was unremarkable with no evidence of focal neurological deficit.  He has no recent history of diarrhea or GI  symptoms and therefore guillain barre syndrome very disease is less likely.  Austin current condition can be due to deconditioning of physical body function. - Can consider obtaining an MRI - PT/OT evaluation - Orthostatic vitals  Asymptomatic bacteriuria: Urinalysis in the ED showed trace leukocytes and many bacteria.  Given Austin history of BPH, prostatitis as well possible however denies nocturia and is currently not on any medication for BPH.  Also denies dysuria, urinary urgency, urinary frequency, fevers or chills. - Status post ceftriaxone 2 g in ED -Follow urine cultures  Chronic kidney disease stage III: Baseline creatinine of 1.3-1.5. -Continue to monitor  Hypertension: BP stable.  Resume amlodipine, Toprol-XL, quinapril in the a.m. -Home antihypertensives: Hold Maxide for now as he appears dry on physical exam.  Hiccups: Continue chlorpromazine  Hx of CVA: Continue aspirin -Currently not on statin therapy  Hypokalemia: K+ 3.2.  Will replete.  Anemia-unknown etiology: Hemoglobin of 11.2 (12.9 in 2013).  Current most likely be attributed to Austin chronic kidney disease.  Iron deficiency anemia less likely given Austin normal MCV.  FEN:  Replace electrolytes as needed, heart healthy diet, maintenance normal saline at 100 mL/hour VTE prophylaxis: Subcutaneous Lovenox CODE STATUS: Full code  Dispo: Admit patient to Observation with expected length of stay less than 2 midnights.  Signed: Yvette Rack, MD 11/21/2017, 6:15 AM  Pager: [phone] [facility] PGY-1

## 2017-11-20 NOTE — ED Provider Notes (Signed)
[facility] EMERGENCY DEPARTMENT Provider Note   CSN: [id number] Arrival date & time: 11/20/17  1456     History   Chief Complaint Chief Complaint  Patient presents with  . Extremity Weakness    HPI Austin Baker is a 77 y.o. male.  HPI   77 yo M with PMHx HTN, CVA x 6, prior UTIs here with extremity weakness.  History provided by patient's wife as well as himself.  Per report, the patient has had increasing generalized weakness for last 3 days.  He normally is able to walk around his house without difficulty.  He has baseline blind, but uses a cane.  However, over the last 48 hours, he has had progressively worsening weakness and has now been unable to walk on his own.  He is requiring full assist.  He seems weaker on his right, though he has chronic weakness.  He reports that he feels generally weak.  He denies any new numbness or other complaints.  His speech has been at his baseline.  No recent fevers or chills.  No recent cough.  He did receive his flu shot on Monday, but has not had any fevers, chills, body aches, or other complaints.  He has a history of strokes with similar symptoms.  Denies any headache.  Past Medical History:  Diagnosis Date  . BPH (benign prostatic hypertrophy)   . CVA (cerebral infarction)   . Glaucoma   . Hiccups   . Hypertension   . Hypokalemia   . HYPOMAGNESEMIA 03/31/2006  . Lipoma    pedunculated on left belt line  . Renal insufficiency    Baseline Cr 1.5  . Right inguinal hernia 07/13/2011   Status post open repair 08/19/11   . Stroke (HCC)   . Urinary tract infection    hx of  . Urosepsis    history of    Patient Active Problem List   Diagnosis Date Noted  . Urinary incontinence 04/04/2015  . Preventative health care 11/26/2013  . Skin tag - left hip 4 cm 07/13/2011  . HICCUPS, CHRONIC 01/15/2009  . BPH (benign prostatic hyperplasia) 08/04/2006  . GLAUCOMA 12/28/2005  . BLINDNESS 12/28/2005  . Essential  hypertension 12/28/2005  . Cerebral artery occlusion with cerebral infarction (HCC) 12/28/2005  . DENTAL CARIES 12/28/2005  . CKD (chronic kidney disease) stage 3, GFR 30-59 ml/min (HCC) 12/28/2005    Past Surgical History:  Procedure Laterality Date  . GLAUCOMA SURGERY    . HERNIA REPAIR     as a child  . INGUINAL HERNIA REPAIR  08/19/2011  . INGUINAL HERNIA REPAIR  08/19/2011   Procedure: HERNIA REPAIR INGUINAL ADULT;  Surgeon: Wilmon Arms. Corliss Skains, MD;  Location: [facility] OR;  Service: General;  Laterality: Right;        Home Medications    Prior to Admission medications   Medication Sig Start Date End Date Taking? Authorizing Provider  amLODipine (NORVASC) 10 MG tablet Take 1 tablet (10 mg total) by mouth daily. 11/18/17  Yes Yvette Rack, MD  aspirin 81 MG tablet Take 81 mg by mouth daily.   Yes [provider]  chlorproMAZINE (THORAZINE) 25 MG tablet Take 1 tablet (25 mg total) by mouth 2 (two) times daily. 12/29/16  Yes Rice, Jamesetta Orleans, MD  metoprolol succinate (TOPROL-XL) 100 MG 24 hr tablet TAKE 1 TABLET BY MOUTH ONCE DAILY TAKE  WITH  OR  IMMEDIATELY  FOLLOWING  A  MEAL Patient taking differently: Take 100 mg by  mouth daily.  06/27/17  Yes Inez Catalina, MD  NON FORMULARY Banana: Eat one banana with breakfast every day, per MD's recommendation   Yes [provider]  quinapril (ACCUPRIL) 40 MG tablet TAKE ONE TABLET BY MOUTH ONCE DAILY Patient taking differently: Take 40 mg by mouth daily.  10/04/16  Yes Rice, Jamesetta Orleans, MD  triamterene-hydrochlorothiazide (MAXZIDE-25) 37.5-25 MG tablet Take 1 tablet by mouth daily. 12/29/16  Yes Rice, Jamesetta Orleans, MD    Family History Family History  Problem Relation Age of Onset  . Diabetes Mother   . Hypertension Mother     Social History Social History   Tobacco Use  . Smoking status: Former Smoker    Types: Cigarettes    Last attempt to quit: 01/11/1970    Years since quitting: 47.8  . Smokeless tobacco:  Never Used  Substance Use Topics  . Alcohol use: No    Alcohol/week: 0.0 standard drinks  . Drug use: No     Allergies   Patient has no known allergies.   Review of Systems Review of Systems  Constitutional: Positive for fatigue. Negative for chills and fever.  HENT: Negative for congestion and rhinorrhea.   Eyes: Negative for visual disturbance.  Respiratory: Negative for cough, shortness of breath and wheezing.   Cardiovascular: Negative for chest pain and leg swelling.  Gastrointestinal: Negative for abdominal pain, diarrhea, nausea and vomiting.  Genitourinary: Negative for dysuria and flank pain.  Musculoskeletal: Positive for gait problem. Negative for neck pain and neck stiffness.  Skin: Negative for rash and wound.  Allergic/Immunologic: Negative for immunocompromised state.  Neurological: Positive for weakness. Negative for syncope and headaches.  All other systems reviewed and are negative.    Physical Exam Updated Vital Signs BP (!) 132/101   Pulse 79   Temp 98.7 F (37.1 C) (Oral)   Resp 19   Ht 6\' 1"  (1.854 m)   Wt 74.4 kg   SpO2 100%   BMI 21.64 kg/m   Physical Exam  Constitutional: He is oriented to person, place, and time. He appears well-developed and well-nourished. No distress.  HENT:  Head: Normocephalic and atraumatic.  Mouth/Throat: Oropharynx is clear and moist.  Eyes:  Blind, at baseline.  Neck: Neck supple.  Cardiovascular: Normal rate, regular rhythm and normal heart sounds. Exam reveals no friction rub.  No murmur heard. Pulmonary/Chest: Effort normal and breath sounds normal. No respiratory distress. He has no wheezes. He has no rales.  Abdominal: He exhibits no distension.  Musculoskeletal: He exhibits no edema.  Neurological: He is alert and oriented to person, place, and time. He exhibits normal muscle tone.  Skin: Skin is warm. Capillary refill takes less than 2 seconds.  Psychiatric: He has a normal mood and affect.  Nursing  note and vitals reviewed.   Neurological Exam:  Mental Status: Alert and oriented to person, place, and time. Attention and concentration normal. Speech mildly slurred which family states is baseline. Recent memory is intact. Cranial Nerves: Visual fields grossly intact. EOMI and PERRLA. No nystagmus noted. Facial sensation intact at forehead, maxillary cheek, and chin/mandible bilaterally. No facial asymmetry or weakness. Hearing grossly normal. Uvula is midline, and palate elevates symmetrically. Normal SCM and trapezius strength. Tongue midline without fasciculations. Motor: Muscle strength 5/5 in proximal and distal UE and LE bilaterally, though subjectively weak on right. No pronator drift. Muscle tone normal. Reflexes: 2+ and symmetrical in all four extremities.  Sensation: Intact to light touch in upper and lower extremities distally  bilaterally.  Gait: Deferred Coordination: Deferred    ED Treatments / Results  Labs (all labs ordered are listed, but only abnormal results are displayed) Labs Reviewed  CBC WITH DIFFERENTIAL/PLATELET - Abnormal; Notable for the following components:      Result Value   RBC 4.01 (*)    Hemoglobin 11.2 (*)    HCT 33.9 (*)    Monocytes Absolute 1.1 (*)    All other components within normal limits  COMPREHENSIVE METABOLIC PANEL - Abnormal; Notable for the following components:   Potassium 3.2 (*)    Creatinine, Ser 1.49 (*)    Albumin 3.1 (*)    GFR calc non Af Amer 44 (*)    GFR calc Af Amer 51 (*)    All other components within normal limits  URINALYSIS, ROUTINE W REFLEX MICROSCOPIC - Abnormal; Notable for the following components:   Leukocytes, UA TRACE (*)    Bacteria, UA MANY (*)    All other components within normal limits  URINE CULTURE  TROPONIN I  I-STAT CG4 LACTIC ACID, ED  I-STAT CG4 LACTIC ACID, ED    EKG EKG Interpretation  Date/Time:  Sunday November 20 2017 15:06:51 EST Ventricular Rate:  78 PR Interval:    QRS  Duration: 95 QT Interval:  385 QTC Calculation: 439 R Axis:   -62 Text Interpretation:  Sinus rhythm Left axis deviation Nonspecific T abnormalities, lateral leads No significant change since last tracing Confirmed by Shaune Pollack [phone]) on 11/20/2017 3:40:20 PM   Radiology Dg Chest 2 View  Result Date: 11/20/2017 CLINICAL DATA:  77 year old male with history of right-sided weakness, worsening today. EXAM: CHEST - 2 VIEW COMPARISON:  Chest x-ray 08/10/2011. FINDINGS: Lung volumes are normal. No consolidative airspace disease. No pleural effusions. No pneumothorax. No pulmonary nodule or mass noted. Pulmonary vasculature and the cardiomediastinal silhouette are within normal limits. Aortic atherosclerosis. IMPRESSION: 1.  No radiographic evidence of acute cardiopulmonary disease. 2. Aortic atherosclerosis. Electronically Signed   By: Trudie Reed M.D.   On: 11/20/2017 17:08   Ct Head Wo Contrast  Result Date: 11/20/2017 CLINICAL DATA:  77 year old male with history of right-sided weakness from a stroke in the past. Right leg weaker than usual. EXAM: CT HEAD WITHOUT CONTRAST TECHNIQUE: Contiguous axial images were obtained from the base of the skull through the vertex without intravenous contrast. COMPARISON:  Head CT 07/18/2015. FINDINGS: Brain: Moderate cerebral atrophy. Patchy and confluent areas of decreased attenuation are noted throughout the deep and periventricular white matter of the cerebral hemispheres bilaterally (right greater than left), compatible with chronic microvascular ischemic disease. Focal encephalomalacia/gliosis in the right external capsule from remote infarct. Small lacunar infarct in the right basal ganglia. No evidence of acute infarction, hemorrhage, hydrocephalus, extra-axial collection or mass lesion/mass effect. Vascular: No hyperdense vessel or unexpected calcification. Skull: Normal. Negative for fracture or focal lesion. Sinuses/Orbits: No acute finding.  Other: None. IMPRESSION: 1. No acute intracranial abnormalities. 2. Moderate cerebral atrophy with chronic ischemic changes, similar to the prior study, as above. Electronically Signed   By: Trudie Reed M.D.   On: 11/20/2017 16:47    Procedures Procedures (including critical care time)  Medications Ordered in ED Medications  cefTRIAXone (ROCEPHIN) 2 g in sodium chloride 0.9 % 100 mL IVPB (has no administration in time range)  sodium chloride 0.9 % bolus 1,000 mL (0 mLs Intravenous Stopped 11/20/17 1845)     Initial Impression / Assessment and Plan / ED Course  I have reviewed the triage vital  signs and the nursing notes.  Pertinent labs & imaging results that were available during my care of the patient were reviewed by me and considered in my medical decision making (see chart for details).     77 year old male here with generalized weakness and difficulty walking for the last several days.  Patient initially with complaints of increased right-sided weakness but this appears to be acute on chronic weakness.  I suspect this is reactivation of his stroke symptoms in the setting of possible UTI.  He has a history of the same and has many bacteria and pyuria on a catheterized UA.  Lab work is otherwise at baseline.  CT head is negative.  Given his weakness and difficulty walking on his own, will admit for treatment.  Final Clinical Impressions(s) / ED Diagnoses   Final diagnoses:  Weakness  Acute cystitis without hematuria    ED Discharge Orders    None       Shaune Pollack, MD 11/20/17 [phone]

## 2017-11-20 NOTE — ED Notes (Signed)
[facility] [phone] Cell [phone]

## 2017-11-20 NOTE — ED Triage Notes (Signed)
Pt out of room to CT 

## 2017-11-20 NOTE — ED Triage Notes (Signed)
PT from home with wife . EMS reports the wife called because Pt's RT leg was weaker than usual. Pt at base line has RT sided weakness from a stroke in the Past. Pt  uses a cane to ambulated, but today wife reported Pt had a change and RT leg was weaker.

## 2017-11-21 ENCOUNTER — Other Ambulatory Visit: Payer: Self-pay

## 2017-11-21 ENCOUNTER — Encounter (HOSPITAL_COMMUNITY): Payer: Self-pay | Admitting: Student in an Organized Health Care Education/Training Program

## 2017-11-21 DIAGNOSIS — R5381 Other malaise: Secondary | ICD-10-CM

## 2017-11-21 DIAGNOSIS — Z87440 Personal history of urinary (tract) infections: Secondary | ICD-10-CM

## 2017-11-21 DIAGNOSIS — I129 Hypertensive chronic kidney disease with stage 1 through stage 4 chronic kidney disease, or unspecified chronic kidney disease: Secondary | ICD-10-CM

## 2017-11-21 DIAGNOSIS — K0889 Other specified disorders of teeth and supporting structures: Secondary | ICD-10-CM

## 2017-11-21 DIAGNOSIS — I69351 Hemiplegia and hemiparesis following cerebral infarction affecting right dominant side: Secondary | ICD-10-CM

## 2017-11-21 DIAGNOSIS — N183 Chronic kidney disease, stage 3 (moderate): Secondary | ICD-10-CM

## 2017-11-21 DIAGNOSIS — R8271 Bacteriuria: Secondary | ICD-10-CM

## 2017-11-21 DIAGNOSIS — N400 Enlarged prostate without lower urinary tract symptoms: Secondary | ICD-10-CM

## 2017-11-21 DIAGNOSIS — D649 Anemia, unspecified: Secondary | ICD-10-CM

## 2017-11-21 DIAGNOSIS — Z87891 Personal history of nicotine dependence: Secondary | ICD-10-CM

## 2017-11-21 DIAGNOSIS — Z79899 Other long term (current) drug therapy: Secondary | ICD-10-CM

## 2017-11-21 DIAGNOSIS — Z7982 Long term (current) use of aspirin: Secondary | ICD-10-CM

## 2017-11-21 DIAGNOSIS — E876 Hypokalemia: Secondary | ICD-10-CM

## 2017-11-21 DIAGNOSIS — H547 Unspecified visual loss: Secondary | ICD-10-CM

## 2017-11-21 DIAGNOSIS — R066 Hiccough: Secondary | ICD-10-CM

## 2017-11-21 LAB — CBC
HCT: 35.1 % — ABNORMAL LOW (ref 39.0–52.0)
Hemoglobin: 11.1 g/dL — ABNORMAL LOW (ref 13.0–17.0)
MCH: 26.7 pg (ref 26.0–34.0)
MCHC: 31.6 g/dL (ref 30.0–36.0)
MCV: 84.6 fL (ref 80.0–100.0)
PLATELETS: 374 10*3/uL (ref 150–400)
RBC: 4.15 MIL/uL — AB (ref 4.22–5.81)
RDW: 14.1 % (ref 11.5–15.5)
WBC: 9.2 10*3/uL (ref 4.0–10.5)
nRBC: 0 % (ref 0.0–0.2)

## 2017-11-21 LAB — BASIC METABOLIC PANEL
Anion gap: 7 (ref 5–15)
BUN: 18 mg/dL (ref 8–23)
CALCIUM: 8.7 mg/dL — AB (ref 8.9–10.3)
CO2: 26 mmol/L (ref 22–32)
CREATININE: 1.37 mg/dL — AB (ref 0.61–1.24)
Chloride: 106 mmol/L (ref 98–111)
GFR, EST AFRICAN AMERICAN: 57 mL/min — AB (ref >60)
GFR, EST NON AFRICAN AMERICAN: 49 mL/min — AB (ref >60)
Glucose, Bld: 90 mg/dL (ref 70–99)
Potassium: 3.2 mmol/L — ABNORMAL LOW (ref 3.5–5.1)
SODIUM: 139 mmol/L (ref 135–145)

## 2017-11-21 LAB — GLUCOSE, CAPILLARY: Glucose-Capillary: 97 mg/dL (ref 70–99)

## 2017-11-21 LAB — MRSA PCR SCREENING: MRSA BY PCR: NEGATIVE

## 2017-11-21 MED ORDER — SODIUM CHLORIDE 0.9 % IV SOLN: 1.0000 g | INTRAVENOUS | Status: DC

## 2017-11-21 MED ORDER — POTASSIUM CHLORIDE CRYS ER 10 MEQ PO TBCR
EXTENDED_RELEASE_TABLET | ORAL | Status: AC
  Administered 2017-11-21: 40 meq via ORAL
  Filled 2017-11-21: qty 4

## 2017-11-21 MED ORDER — POTASSIUM CHLORIDE CRYS ER 20 MEQ PO TBCR
40.0000 meq | EXTENDED_RELEASE_TABLET | Freq: Once | ORAL | Status: AC
  Administered 2017-11-21: 40 meq via ORAL

## 2017-11-21 NOTE — Evaluation (Signed)
Physical Therapy Evaluation Patient Details Name: Austin Baker MRN: [id number] DOB: 1940/10/29 Today's Date: 11/21/2017   History of Present Illness  Pt adm with generalized weakness and UTI. 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 Pt with history of CVA's with chronic rt weakness but today left side is functionally weaker with some posturing of left hand which pt can correct with verbal cues. Recommend ST-SNF for further rehab prior to return home.     Follow Up Recommendations SNF;Supervision/Assistance - 24 hour    Equipment Recommendations  Other (comment)(To be determined)    Recommendations for Other Services       Precautions / Restrictions Precautions Precautions: Fall Restrictions Weight Bearing Restrictions: No      Mobility  Bed Mobility Overal bed mobility: Needs Assistance Bed Mobility: Supine to Sit     Supine to sit: Min assist     General bed mobility comments: Assist to bring legs off of bed, elevate trunk into sitting, and bring hips to EOB  Transfers Overall transfer level: Needs assistance Equipment used: Rolling walker (2 wheeled);Straight cane;Ambulation equipment used Transfers: Sit to/from [employer] Sit to Stand: Mod assist;From elevated surface Stand pivot transfers: (with Stedy)       General transfer comment: Attempted to stand with cane and pt unable. Stood with rolling walker with mod assist but unable to fully extend hips and trunk. Stood with [place] and pivoted to chair with [employer].  Ambulation/Gait             General Gait Details: Unable to perform  Stairs            Wheelchair Mobility    Modified Rankin (Stroke Patients Only)       Balance Overall balance assessment: Needs  assistance Sitting-balance support: No upper extremity supported;Feet supported Sitting balance-Leahy Scale: Good     Standing balance support: Bilateral upper extremity supported Standing balance-Leahy Scale: Poor Standing balance comment: Stedy and min/mod assist for static standing                             Pertinent Vitals/Pain Pain Assessment: No/denies pain    Home Living Family/patient expects to be discharged to:: Private residence Living Arrangements: Other relatives(sister) Available Help at Discharge: Family Type of Home: Apartment Home Access: Stairs to enter Entrance Stairs-Rails: Right;Left;Can reach both Secretary/administrator of Steps: 2 flights Home Layout: One level Home Equipment: Cane - single point      Prior Function Level of Independence: Needs assistance   Gait / Transfers Assistance Needed: Modified independent with cane in house. Assist for stairs           Hand Dominance        Extremity/Trunk Assessment   Upper Extremity Assessment Upper Extremity Assessment: Defer to OT evaluation    Lower Extremity Assessment Lower Extremity Assessment: RLE deficits/detail;LLE deficits/detail;Generalized weakness RLE Deficits / Details: grossly 4+/5 with testing but functionally weaker LLE Deficits / Details: grossly 4/5 with testing but functionally weaker       Communication   Communication: Other (comment)(speech difficult to understand at times)  Cognition Arousal/Alertness: Awake/alert Behavior During Therapy: WFL for tasks assessed/performed Overall Cognitive Status: No family/caregiver present to determine baseline cognitive functioning Area of Impairment: Following commands;Safety/judgement;Problem solving  Following Commands: Follows one step commands with increased time Safety/Judgement: Decreased awareness of safety;Decreased awareness of deficits   Problem Solving: Slow  processing;Decreased initiation;Difficulty sequencing;Requires verbal cues;Requires tactile cues        General Comments      Exercises     Assessment/Plan    PT Assessment Patient needs continued PT services  PT Problem List Decreased strength;Decreased activity tolerance;Decreased balance;Decreased mobility;Decreased cognition;Decreased safety awareness       PT Treatment Interventions DME instruction;Gait training;Stair training;Functional mobility training;Therapeutic exercise;Therapeutic activities;Balance training;Patient/family education    PT Goals (Current goals can be found in the Care Plan section)  Acute Rehab PT Goals Patient Stated Goal: go home to his radio PT Goal Formulation: With patient Time For Goal Achievement: 12/05/17 Potential to Achieve Goals: Fair    Frequency Min 3X/week   Barriers to discharge Inaccessible home environment second story apartment    Co-evaluation               AM-PAC PT "6 Clicks" Daily Activity  Outcome Measure Difficulty turning over in bed (including adjusting bedclothes, sheets and blankets)?: Unable Difficulty moving from lying on back to sitting on the side of the bed? : Unable Difficulty sitting down on and standing up from a chair with arms (e.g., wheelchair, bedside commode, etc,.)?: Unable Help needed moving to and from a bed to chair (including a wheelchair)?: A Lot Help needed walking in hospital room?: Total Help needed climbing 3-5 steps with a railing? : Total 6 Click Score: 7    End of Session Equipment Utilized During Treatment: Gait belt Activity Tolerance: Patient limited by fatigue Patient left: in chair;with call bell/phone within reach;with chair alarm set Nurse Communication: Mobility status;Need for lift equipment PT Visit Diagnosis: Other abnormalities of gait and mobility (R26.89);Muscle weakness (generalized) (M62.81);Difficulty in walking, not elsewhere classified (R26.2)    Time:  1610-9604 PT Time Calculation (min) (ACUTE ONLY): 37 min   Charges:   PT Evaluation $PT Eval Moderate Complexity: 1 Mod PT Treatments $Therapeutic Activity: 8-22 mins        [facility] PT Acute Rehabilitation Services Pager [phone] Office [phone]   Angelina Ok [facility] 11/21/2017, 10:06 AM

## 2017-11-21 NOTE — Discharge Summary (Signed)
Name: Austin Baker MRN: [id number] DOB: 08/11/1940 77 y.o. PCP: Yvette Rack, MD  Date of Admission: 11/20/2017  2:56 PM Date of Discharge: 11/25/2017 Attending Physician: Tyson Alias, *  Discharge Diagnosis: 1. Physical Deconditioning   Discharge Medications: Allergies as of 11/25/2017   No Known Allergies     Medication List    TAKE these medications   amLODipine 10 MG tablet Commonly known as:  NORVASC Take 1 tablet (10 mg total) by mouth daily.   aspirin 81 MG tablet Take 81 mg by mouth daily.   chlorproMAZINE 25 MG tablet Commonly known as:  THORAZINE Take 1 tablet (25 mg total) by mouth 2 (two) times daily.   metoprolol succinate 100 MG 24 hr tablet Commonly known as:  TOPROL-XL TAKE 1 TABLET BY MOUTH ONCE DAILY TAKE  WITH  OR  IMMEDIATELY  FOLLOWING  A  MEAL What changed:  See the new instructions.   NON FORMULARY Banana: Eat one banana with breakfast every day, per MD's recommendation   quinapril 40 MG tablet Commonly known as:  ACCUPRIL TAKE ONE TABLET BY MOUTH ONCE DAILY   triamterene-hydrochlorothiazide 37.5-25 MG tablet Commonly known as:  MAXZIDE-25 Take 1 tablet by mouth daily.       Disposition and follow-up:   Mr.Zavon Beadnell was discharged from [facility] in Stable condition.  At the hospital follow up visit please address:  1.  Deconditioning- please assess if patient is benefiting from SNF placement rehab  2.  Labs / imaging needed at time of follow-up: none  3.  Pending labs/ test needing follow-up: none  Follow-up Appointments:  Contact information for follow-up providers    [facility]. Go on 11/30/2017.   Why:  1:15 pm Contact information: [address] [place] [address] [phone]           Contact information for after-discharge care    Destination    [facility] .   Service:  Skilled Nursing Contact  information: [address] [place] [address] [phone]                  Hospital Course by problem list: 1. Physical deconditioning- presented with overall body/generalized weakness. His sister who is his caregiver reports that usually he ambulates with a cane at baseline however for the past 3 days he has required assistance with ambulation. He requires assistance using the bathroom and ambulating around the house. History of prior strokes on aspirin. PT recommended SNF. He had one fever the night before discharge; however, he had no complaints and no signs of hospital acquired infection. Chest xray was normal with no signs of atelectasis or infiltrates. He is medically stable for discharge to rehab.   Discharge Vitals:   BP 124/81 (BP Location: Left Arm)   Pulse (!) 101   Temp 98.6 F (37 C) (Oral)   Resp 19   Ht 6\' 2"  (1.88 m)   Wt 68.9 kg   SpO2 99%   BMI 19.50 kg/m   Pertinent Labs, Studies, and Procedures:   CBC Latest Ref Rng & Units 11/21/2017 11/20/2017 08/20/2011  WBC 4.0 - 10.5 K/uL 9.2 9.8 12.4(H)  Hemoglobin 13.0 - 17.0 g/dL 11.1(L) 11.2(L) 12.9(L)  Hematocrit 39.0 - 52.0 % 35.1(L) 33.9(L) 35.8(L)  Platelets 150 - 400 K/uL 374 321 196   CMP Latest Ref Rng & Units 11/24/2017 11/23/2017 11/22/2017  Glucose 70 - 99 mg/dL 244(W) 92  106(H)  BUN 8 - 23 mg/dL 16(X) 20 15  Creatinine 0.61 - 1.24 mg/dL 0.96(E) 4.54(U) 9.81(X)  Sodium 135 - 145 mmol/L 140 138 136  Potassium 3.5 - 5.1 mmol/L 4.0 3.3(L) 3.2(L)  Chloride 98 - 111 mmol/L 108 107 106  CO2 22 - 32 mmol/L 23 24 23   Calcium 8.9 - 10.3 mg/dL 8.9 9.1(Y) 8.9  Total Protein 6.5 - 8.1 g/dL - - -  Total Bilirubin 0.3 - 1.2 mg/dL - - -  Alkaline Phos 38 - 126 U/L - - -  AST 15 - 41 U/L - - -  ALT 0 - 44 U/L - - -   Dg Chest 2 View  Result Date: 11/25/2017 CLINICAL DATA:  Fever, hypertension, tachycardia EXAM: CHEST - 2 VIEW COMPARISON:  11/20/2017 FINDINGS: Lungs remain clear. No  focal pneumonia, collapse or consolidation. Negative for edema, effusion or pneumothorax. Trachea is midline. Normal heart size. Aorta is atherosclerotic and ectatic with slight tortuosity. IMPRESSION: No acute chest process. Thoracic aortic ectasia and atherosclerosis Electronically Signed   By: Judie Petit.  Shick M.D.   On: 11/25/2017 10:27     Discharge Instructions: Discharge Instructions    Diet - low sodium heart healthy   Complete by:  As directed    Diet - low sodium heart healthy   Complete by:  As directed    Diet - low sodium heart healthy   Complete by:  As directed    Discharge instructions   Complete by:  As directed    Mr. Vinje,   Your weakness is most likely due to phyiscal deconditioning. We recommended a skilled nursing facility for more rigorous physical therapy to help strengthen your muscles. However, you are being discharged home with home health physical therapy.   Please resume your medications as prescribed and follow up with your PCP on 11/20 at 1:15 pm.   Discharge instructions   Complete by:  As directed    Mr. Suliman, [place] were hospitalized due to physical deconditioning. We are recommending short term rehab to help strengthen you. Please resume all your medications as prescribed.   Thanks for allowing [place] to be a part of your care!   Increase activity slowly   Complete by:  As directed    Increase activity slowly   Complete by:  As directed    Increase activity slowly   Complete by:  As directed       Signed: Jaci Standard, DO 11/25/2017, 2:44 PM   Pager: [phone]

## 2017-11-21 NOTE — Evaluation (Signed)
Occupational Therapy Evaluation Patient Details Name: Austin Baker MRN: [id number] DOB: [birth year] Today's Date: 11/21/2017    History of Present Illness Pt adm with generalized weakness and UTI. 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 Pt currently requiring Mod A for UB ADLs, Max A for LB ADLs, and Max A +2 with sara stedy for functional transfers. Pt presenting with decreased strength, balance, and activity tolerance. Pt's functional performance in hospital is also impacted by baseline blindness and he requires increased cues throughout. Pt would benefit from further acute OT to facilitate safe dc. Recommend dc to SNF for further OT to optimize safety, independence with ADLs, and return to PLOF.   BP: Supine 119/82 Sitting 121/88 Standing 149/75 Sitting at EOB after transfer 148/95    Follow Up Recommendations  SNF;Supervision/Assistance - 24 hour    Equipment Recommendations  Other (comment)(Defer to next venue)    Recommendations for Other Services PT consult     Precautions / Restrictions Precautions Precautions: Fall Restrictions Weight Bearing Restrictions: No      Mobility Bed Mobility Overal bed mobility: Needs Assistance Bed Mobility: Sit to Supine       Sit to supine: Max assist;+2 for physical assistance   General bed mobility comments: Max +2 for elevating LEs and faciltiating trunk movement  Transfers Overall transfer level: Needs assistance Equipment used: Rolling walker (2 wheeled);Straight cane;Ambulation equipment used Transfers: Sit to/from [employer] Sit to Stand: Max assist;+2 physical assistance Stand pivot transfers: (with Stedy)       General transfer comment: Max  A +2 with stedy from reclienr surface    Balance Overall balance assessment: Needs assistance Sitting-balance support: No upper extremity supported;Feet supported Sitting balance-Leahy Scale: Good     Standing balance support: Bilateral upper extremity supported Standing balance-Leahy Scale: Poor Standing balance comment: Stedy and min/mod assist for static standing                           ADL either performed or assessed with clinical judgement   ADL Overall ADL's : Needs assistance/impaired Eating/Feeding: Minimal assistance;Sitting   Grooming: Minimal assistance;Sitting   Upper Body Bathing: Moderate assistance;Sitting   Lower Body Bathing: Maximal assistance;Bed level   Upper Body Dressing : Moderate assistance;Sitting   Lower Body Dressing: Maximal assistance;Sit to/from stand   Toilet Transfer: Maximal assistance;+2 for safety/equipment;+2 for physical assistance(sit<>stand with stedy)   Toileting- Clothing Manipulation and Hygiene: Maximal assistance;Sit to/from stand;Bed level       Functional mobility during ADLs: Maximal assistance;+2 for physical assistance(sit<>stand with stedy only) General ADL Comments: Pt requiring increased cues due to baseline blindness. Pt presenting with decreased strnegth, coorindation, and balance. Pt with urinary incontience during session     Vision Baseline Vision/History: Legally blind       Perception     Praxis      Pertinent Vitals/Pain Pain Assessment: No/denies pain     Hand Dominance     Extremity/Trunk Assessment Upper Extremity Assessment Upper Extremity Assessment: Generalized weakness   Lower Extremity Assessment Lower Extremity Assessment: Defer to PT evaluation RLE Deficits / Details: grossly 4+/5 with testing but functionally weaker LLE Deficits / Details: grossly 4/5 with testing but functionally weaker  Communication Communication Communication: Other (comment)(speech difficult  to understand at times)   Cognition Arousal/Alertness: Awake/alert Behavior During Therapy: WFL for tasks assessed/performed Overall Cognitive Status: No family/caregiver present to determine baseline cognitive functioning Area of Impairment: Following commands;Safety/judgement;Problem solving                       Following Commands: Follows one step commands with increased time Safety/Judgement: Decreased awareness of safety;Decreased awareness of deficits   Problem Solving: Slow processing;Decreased initiation;Difficulty sequencing;Requires verbal cues;Requires tactile cues General Comments: Pt requiring increased time and cues.    General Comments  RN present throughout. Orthostrics taken during session    Exercises     Shoulder Instructions      Home Living Family/patient expects to be discharged to:: Private residence Living Arrangements: Other relatives(sister) Available Help at Discharge: Family Type of Home: Apartment Home Access: Stairs to enter Secretary/administrator of Steps: 2 flights Entrance Stairs-Rails: Right;Left;Can reach both Home Layout: One level               Home Equipment: Cane - single point          Prior Functioning/Environment Level of Independence: Needs assistance  Gait / Transfers Assistance Needed: Modified independent with cane in house. Assist for stairs ADL's / Homemaking Assistance Needed: Performing BADLs            OT Problem List: Decreased strength;Decreased range of motion;Decreased activity tolerance;Impaired balance (sitting and/or standing);Decreased knowledge of use of DME or AE;Decreased knowledge of precautions;Pain      OT Treatment/Interventions: Self-care/ADL training;Therapeutic exercise;Energy conservation;DME and/or AE instruction;Therapeutic activities;Patient/family education    OT Goals(Current goals can be found in the care plan section) Acute Rehab OT Goals Patient Stated Goal: go home to his  radio OT Goal Formulation: With patient Time For Goal Achievement: 12/05/17 Potential to Achieve Goals: Good  OT Frequency: Min 2X/week   Barriers to D/C:            Co-evaluation              AM-PAC PT "6 Clicks" Daily Activity     Outcome Measure Help from another person eating meals?: A Little Help from another person taking care of personal grooming?: A Little Help from another person toileting, which includes using toliet, bedpan, or urinal?: A Lot Help from another person bathing (including washing, rinsing, drying)?: A Lot Help from another person to put on and taking off regular upper body clothing?: A Lot Help from another person to put on and taking off regular lower body clothing?: A Lot 6 Click Score: 14   End of Session Equipment Utilized During Treatment: Gait belt;Other (comment)(stedy) Nurse Communication: Mobility status  Activity Tolerance: Patient tolerated treatment well;Patient limited by fatigue Patient left: in bed;with call bell/phone within reach;with nursing/sitter in room  OT Visit Diagnosis: Unsteadiness on feet (R26.81);Other abnormalities of gait and mobility (R26.89);Muscle weakness (generalized) (M62.81)                Time: 1191-4782 OT Time Calculation (min): 22 min Charges:  OT General Charges $OT Visit: 1 Visit OT Evaluation $OT Eval Moderate Complexity: 1 Mod  Ayme Short MSOT, OTR/L Acute Rehab Pager: [phone] Office: [phone]  Theodoro Grist Jaccob Czaplicki 11/21/2017, 4:16 PM

## 2017-11-21 NOTE — Progress Notes (Signed)
   Subjective: Austin Baker reported feeling better this morning. He said he has chronic right sided weakness but was now feeling it on both lower extremities. He said he lives at home with his sister and is able to get around the house sometimes but usually requires help to get into the tub and out. He spends most of his day watching TV programs. He said he does bed exercises at home.   Objective:  Vital signs in last 24 hours: Vitals:   11/20/17 2030 11/20/17 2116 11/21/17 0120 11/21/17 0242  BP: 126/86 124/73 125/83 140/87  Pulse: 80 81 81 88  Resp: (!) 23 (!) 22 18 20   Temp:    98.5 F (36.9 C)  TempSrc:    Oral  SpO2: 99% 99% 100% 100%  Weight:    68.9 kg  Height:    6\' 2"  (1.88 m)   Physical Exam  Constitutional: He is oriented to person, place, and time.  Cardiovascular: Normal rate and regular rhythm.  Musculoskeletal: He exhibits no edema.  Neurological: He is alert and oriented to person, place, and time.  Bilateral LE and UE strength 5/5, sensation intact   Skin: Skin is warm and dry.    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  Physical Decondtioning  Patient has right lower extremity weakness at baseline from his previous CVAs but now presents with generalized weakness. Unlikely stroke vs TIA given patient is having bilateral generalized weakness and not any focal deficits. On exam his strength was 5/5 bilaterally on UE and LE. This is most likely in the setting of deconditioning.  - PT/OT evaluation - Orthostatic vitals  Asymptomatic bacteriuria Urinalysis in the ED showed trace leukocytes and many bacteria. Denies dysuria, urinary urgency, urinary frequency, fevers or chills. No further workup  needed at this time. Can consider medical therapy for BPH if patient is having s/sx   Hypertension - Hold Maxide for now as he appears dry on physical exam.  Hx of CVA Continue aspirin -Currently not on statin therapy, will consider starting him on statin therapy  Hypokalemia K+ 3.2.  Will replete.   Dispo: Anticipated discharge pending PT/OT evaluation and possible SNF placement.  Mollyann Halbert N, DO 11/21/2017, 10:04 AM Pager: [phone]

## 2017-11-21 NOTE — Progress Notes (Signed)
Pt arrived to unit from ED. VS stable. A&Ox4. Will continue to monitor.

## 2017-11-21 NOTE — Progress Notes (Signed)
Pharmacy Students rounding with the Internal Medicine/[facility] Teaching Service. Reviewed H&P note which stated patient had multiple past CVAs along with hypertension. Patient was also a previous smoker. No current statin therapy is listed. Last lipid panel was in 2015 and we calculated ASCVD risk based on those lab values. ASCVD risk was calculated to be 24.1%. According to the 2019 ACC/AHA Guideline on the Primary Prevention of Cardiovascular Disease, it is recommended to discuss the initiation of a high-intensity statin to reduce LDL-C by ?50% in a patient 43-31 years of age with LDL-C ?70 mg/dl and <161 mg/dl without diabetes and with a risk of ?20%. Atorvastatin (40 mg or 80 mg tablet) OR rosuvastatin (20 mg or 40 mg tablet) could be initiated as these are both high-intensity statins. Atorvastatin requires no renal adjustment, but its metabolism is inhibited by grapefruit juice (CYP3A4). Rosuvastatin requires a dose reduction to 5-10 mg if CrCl <30 mL/min. When searching past patient information, it was stated that patient and sister were resistant to statin therapy with no contraindication listed. If patient is still unwilling to start a statin, an alternative could be Zetia 10 mg by mouth once daily per [employer].  In reviewing patient's chart, patient was found to have a history of BPH, but was not on an alpha1 blocker. Discussed with Dr. Oswaldo Done, who explained that therapy is not typically initiated if patient is asymptomatic. According to the Beer's Criteria, alpha1 blockers are cited as potentially harmful medications in the elderly.  [facility] Osamah Schmader & Lonna Cobb, P4 Pharmacy Students

## 2017-11-22 LAB — BASIC METABOLIC PANEL
Anion gap: 7 (ref 5–15)
BUN: 15 mg/dL (ref 8–23)
CALCIUM: 8.9 mg/dL (ref 8.9–10.3)
CHLORIDE: 106 mmol/L (ref 98–111)
CO2: 23 mmol/L (ref 22–32)
CREATININE: 1.3 mg/dL — AB (ref 0.61–1.24)
GFR, EST NON AFRICAN AMERICAN: 52 mL/min — AB (ref >60)
Glucose, Bld: 106 mg/dL — ABNORMAL HIGH (ref 70–99)
Potassium: 3.2 mmol/L — ABNORMAL LOW (ref 3.5–5.1)
SODIUM: 136 mmol/L (ref 135–145)

## 2017-11-22 MED ORDER — POTASSIUM CHLORIDE CRYS ER 20 MEQ PO TBCR: 40.0000 meq | EXTENDED_RELEASE_TABLET | Freq: Once | ORAL | Status: DC

## 2017-11-22 NOTE — Progress Notes (Signed)
   Subjective: No overnight events. Mr. Mayford KnifeWilliams reported feeling well this morning. He said his generalized weakness felt a little better. He was amenable to short-term rehab.  Objective:  Vital signs in last 24 hours: Vitals:   11/21/17 0242 11/21/17 1125 11/21/17 1428 11/21/17 2005  BP: 140/87 131/79 119/82 (!) 145/85  Pulse: 88  80 75  Resp: 20  (!) 23   Temp: 98.5 F (36.9 C) 99.7 F (37.6 C)  99.4 F (37.4 C)  TempSrc: Oral Oral Oral Oral  SpO2: 100%  100% 98%  Weight: 68.9 kg     Height: 6\' 2"  (1.88 m)      Physical Exam  Constitutional: He is oriented to person, place, and time and well-developed, well-nourished, and in no distress.  Musculoskeletal: He exhibits no edema.  Neurological: He is alert and oriented to person, place, and time.  Strength 5/5 bilateral LE     Assessment/Plan:  Principal Problem:   Physical deconditioning  Mr. Mayford KnifeWilliams is a 77 year old pleasant gentleman withcongenital blindness,prior multipleCVAs [phone]/1990swith residual right sided weakness, urinary tract infection, BPH, chronic kidney disease stage III, hypertensionpresenting withweakness.  Physical Decondtioning  Patient said he feels like his weakness is doing a little better today. He is amenable to SNF placement. Orthostatic vitals were wnl. Pending SNF placement.  Hypertension - Hold Maxide for nowas he appears dry on physical exam. Can restart if necessary   Hypokalemia K+ 3.2. Will replete. -f/u bmp  Dispo: Anticipated discharge pending SNF placement  Marikay Roads N, DO 11/22/2017, 7:47 AM Pager: [phone]

## 2017-11-22 NOTE — Care Management Note (Signed)
Case Management Note  Patient Details  Name: Austin Baker MRN: [id number] Date of Birth: [birth year]  Subjective/Objective:   Pt admitted with extremity weakness                 Action/Plan:  PTA from home with sister, pt is blind but PTA was functional.  SNF recommended - CSW consulted and following   Expected Discharge Date:                  Expected Discharge Plan:  Skilled Nursing Facility  In-House Referral:  Clinical Social Work  Discharge planning Services  CM Consult  Post Acute Care Choice:    Choice offered to:     DME Arranged:    DME Agency:     HH Arranged:    HH Agency:     Status of Service:  In process, will continue to follow  If discussed at Long Length of Stay Meetings, dates discussed:    Additional Comments:  Cherylann Parr, RN 11/22/2017, 10:56 AM

## 2017-11-22 NOTE — Progress Notes (Signed)
Internal Medicine Attending:   I saw and examined the patient. I reviewed the resident's note and I agree with the resident's findings and plan as documented in the resident's note.   Principal Problem:   Physical deconditioning   Stable condition today, was lying in the chair listening to TV, stable neuro exam with good strength throughout. Continues to be deconditioned and will require subacute rehab services. Ok for discharge when SNF placement available.  Erlinda Honguncan Vincent, MD

## 2017-11-23 LAB — BASIC METABOLIC PANEL
ANION GAP: 7 (ref 5–15)
BUN: 20 mg/dL (ref 8–23)
CHLORIDE: 107 mmol/L (ref 98–111)
CO2: 24 mmol/L (ref 22–32)
Calcium: 8.8 mg/dL — ABNORMAL LOW (ref 8.9–10.3)
Creatinine, Ser: 1.35 mg/dL — ABNORMAL HIGH (ref 0.61–1.24)
GFR calc Af Amer: 58 mL/min — ABNORMAL LOW (ref >60)
GFR calc non Af Amer: 50 mL/min — ABNORMAL LOW (ref >60)
GLUCOSE: 92 mg/dL (ref 70–99)
POTASSIUM: 3.3 mmol/L — AB (ref 3.5–5.1)
Sodium: 138 mmol/L (ref 135–145)

## 2017-11-23 MED ORDER — POTASSIUM CHLORIDE CRYS ER 20 MEQ PO TBCR
40.0000 meq | EXTENDED_RELEASE_TABLET | Freq: Once | ORAL | Status: AC
  Administered 2017-11-23: 40 meq via ORAL
  Filled 2017-11-23: qty 2

## 2017-11-23 NOTE — Progress Notes (Signed)
Internal Medicine Attending:   I saw and examined the patient. I reviewed the resident's note and I agree with the resident's findings and plan as documented in the resident's note.   Hospital day #3 with decreased funcitonal status at home due to deconditioning and a residual weakness from prior CVA. He is at his baseline though PT is recommending SNF for subacute rehab. Patient is amenable. Family notified team today they want him to come home. Will coordinate with the patient his preference as the patient is his own medical decision maker. Ok for discharge today pending resolution of location.   Erlinda Honguncan Roshaun Pound, MD

## 2017-11-23 NOTE — Care Management Note (Addendum)
Case Management Note  Patient Details  Name: Austin Baker MRN: [id number] Date of Birth: 07/01/1940  Subjective/Objective:   Pt admitted with extremity weakness                 Action/Plan:  PTA from home with sister, pt is blind but PTA was functional.  SNF recommended - CSW consulted and following   Expected Discharge Date:  11/23/17               Expected Discharge Plan:  Skilled Nursing Facility  In-House Referral:  Clinical Social Work  Discharge planning Services  CM Consult  Post Acute Care Choice:    Choice offered to:     DME Arranged:    DME Agency:     HH Arranged:    HH Agency:     Status of Service:  In process, will continue to follow  If discussed at Long Length of Stay Meetings, dates discussed:    Additional Comments: 11/23/2017  Pts sister called CM back.  CM began to give sister HH choice and sister informed CM that she was told in the ED that pt would be able to go to a facility for rehab to get him to baseline so he can return home.  Ms Vladimir FasterColvin informed CM that she is still in agreement with this plan.  CM explained that SNF placement would be for rehab not long placement.  Ms Vladimir FasterColvin informed CM that she has always been in agreement with short term rehab and was confused when SNF verbiage was used.  CM immediately contacted attending/CSW and informed that pt/sister are actually in agreement to discharge to SNF for rehab. Pt remains 2 plus assist and sister will not be able to provide 24 hour supervision nor can she manage pt at current debilitated state.  CM informed by CSW that pt/sister have refused SNF - [facility]  CM spoke in detail with pt and reiterated the recommendation to discharge to SNF - pt confirmed he wants to discharge home in care of his sister.  Sister not at bedside - CM left 2 voicemail'Baker requesting callback to discharge [facility] agencies and discharge transportation.  Pt informed CM that at home he ambulates with a cane -   CM reminded pt that he has suffered some deconditioning with recent sickness and has some secondary deconditioning.  Pt acknowledged CMs concerns however pt remained adamant about returning home.  Pt will need PTAR transport at discharge.  CM provided discharge packet to bedside nurse for PTAR transport once sister can confirmed desired arrival time in the home and home health agency choice.  [facility]. Austin Baker, Austin Yawn S, RN 11/23/2017, 3:13 PM

## 2017-11-23 NOTE — Progress Notes (Signed)
   Subjective: No acute overnight events. Austin Baker reported feeling well this morning. He understood we were still waiting on SNF placement.   Objective:  Vital signs in last 24 hours: Vitals:   11/23/17 0100 11/23/17 0300 11/23/17 0425 11/23/17 0742  BP: 124/81 120/74 120/74 112/75  Pulse: 88 83 84 89  Resp: 18 (!) 28 19 (!) 23  Temp: 97.6 F (36.4 C) 98.1 F (36.7 C)  98.7 F (37.1 C)  TempSrc: Oral Oral  Oral  SpO2: 99% 97% 99% 99%  Weight:      Height:       Physical Exam  Constitutional: He is oriented to person, place, and time. No distress.  Neurological: He is alert and oriented to person, place, and time.  Skin: He is not diaphoretic.    Assessment/Plan:  Principal Problem:   Physical deconditioning  Austin Baker is a 77 year old pleasant gentleman withcongenital blindness,prior multipleCVAs [phone]/1990swith residual right sided weakness, urinary tract infection, BPH, chronic kidney disease stage III, hypertensionpresenting withweakness.  Physical Decondtioning Patient said he feels like his weakness is better this morning. He is amenable to SNF placement. Pending SNF placement.  Hypertension -continue to hold Maxide for nowas he appears dry on physical exam. Can restart if necessary   Hypokalemia K+ 3.3. Will replete. -f/u bmp  Dispo: Anticipated discharge pending SNF placement.  Jaci StandardRehman, Areeg N, DO 11/23/2017, 10:52 AM Pager: [phone]

## 2017-11-23 NOTE — Consult Note (Signed)
   [facility]   11/23/2017  Guy SandiferHarold Klapper 04/04/1940 [id number]   11/24/17: Update - Reviewed MD notes and patient's family wanted SNF. Will sign off.  [facility] for community follow up for post hospital follow up with [employer] Medicare.  Consulted because patient and family refusing skilled nursing care for community follow up support.  Came to the bedside to speak with patient and family.  Patient is blind per nurse and asleep, no family support at the bedside. Patient's nurse states sister was here earlier and confirms she will be the caregiver.  Chart review reveals the patient is has been seen by Dr. Annita BrodPhilip Asenso, which [facility]. Call attempts to the patient's sister, [name] at [phone] and left a generic message for a return call to confirm information regarding this referral. Will follow up regarding the eligibility of this referral.  Please contact for questions,  Charlesetta ShanksVictoria Fareeda Downard, RN BSN CCM [facility] Liaison  [phone] business mobile phone Toll free office [phone]

## 2017-11-23 NOTE — Progress Notes (Signed)
Occupational Therapy Treatment Patient Details Name: Austin SandiferHarold Baker MRN: [id number] DOB: 11/18/1940 Today's Date: 11/23/2017    History of present illness Pt adm with generalized weakness and UTI. PMH - congenital blindness, prior multiple CVAs in 1980s/1990s with residual right sided weakness, urinary tract infection, BPH, chronic kidney disease stage III, hypertension    OT comments  Pt continues to require extensive assistance of 2 to stand and is unable to take steps due to weakness. Max assist for LB dressing. Pt is cooperative and puts forth excellent effort. Continues to be appropriate for ST rehab in SNF.   Follow Up Recommendations  SNF;Supervision/Assistance - 24 hour    Equipment Recommendations       Recommendations for Other Services      Precautions / Restrictions Precautions Precautions: Fall Precaution Comments: pt is blind Restrictions Weight Bearing Restrictions: No       Mobility Bed Mobility               General bed mobility comments: OOB in chair  Transfers Overall transfer level: Needs assistance Equipment used: Rolling walker (2 wheeled)(back of chair) Transfers: Sit to/from Stand Sit to Stand: Max assist;+2 physical assistance         General transfer comment: performed x 2, cues and max assist to position hips at edge of chair, assist to rise and steady using momentum    Balance Overall balance assessment: Needs assistance Sitting-balance support: No upper extremity supported;Feet supported Sitting balance-Leahy Scale: Fair     Standing balance support: Bilateral upper extremity supported Standing balance-Leahy Scale: Poor Standing balance comment: B UE and external support                           ADL either performed or assessed with clinical judgement   ADL Overall ADL's : Needs assistance/impaired Eating/Feeding: Sitting;Set up Eating/Feeding Details (indicate cue type and reason): drinking from straw                  Lower Body Dressing: Maximal assistance;Sitting/lateral leans Lower Body Dressing Details (indicate cue type and reason): for slip on shoes             Functional mobility during ADLs: Rolling walker(unable to take steps)       Vision   Additional Comments: pt is blind   Perception     Praxis      Cognition Arousal/Alertness: Awake/alert Behavior During Therapy: WFL for tasks assessed/performed Overall Cognitive Status: No family/caregiver present to determine baseline cognitive functioning Area of Impairment: Following commands;Safety/judgement;Problem solving                       Following Commands: Follows one step commands with increased time Safety/Judgement: Decreased awareness of safety;Decreased awareness of deficits   Problem Solving: Slow processing;Decreased initiation;Difficulty sequencing;Requires verbal cues;Requires tactile cues General Comments: Pt requiring increased time and cues.         Exercises Exercises: General Lower Extremity General Exercises - Lower Extremity Long Arc Quad: 15 reps;Both;Seated Hip ABduction/ADduction: 10 reps;Both;Seated Hip Flexion/Marching: 20 reps;Both;Seated Heel Raises: 10 reps;Both;Seated   Shoulder Instructions       General Comments      Pertinent Vitals/ Pain       Pain Assessment: No/denies pain  Home Living  Prior Functioning/Environment              Frequency  Min 2X/week        Progress Toward Goals  OT Goals(current goals can now be found in the care plan section)  Progress towards OT goals: Progressing toward goals  Acute Rehab OT Goals Patient Stated Goal: go home to his radio OT Goal Formulation: With patient Time For Goal Achievement: 12/05/17 Potential to Achieve Goals: Good  Plan Discharge plan remains appropriate    Co-evaluation    PT/OT/SLP Co-Evaluation/Treatment: Yes Reason for  Co-Treatment: For patient/therapist safety PT goals addressed during session: Mobility/safety with mobility OT goals addressed during session: ADL's and self-care;Strengthening/ROM      AM-PAC PT "6 Clicks" Daily Activity     Outcome Measure   Help from another person eating meals?: A Little Help from another person taking care of personal grooming?: A Little Help from another person toileting, which includes using toliet, bedpan, or urinal?: A Lot Help from another person bathing (including washing, rinsing, drying)?: A Lot Help from another person to put on and taking off regular upper body clothing?: A Lot Help from another person to put on and taking off regular lower body clothing?: A Lot 6 Click Score: 14    End of Session Equipment Utilized During Treatment: Gait belt;Rolling walker  OT Visit Diagnosis: Unsteadiness on feet (R26.81);Other abnormalities of gait and mobility (R26.89);Muscle weakness (generalized) (M62.81);Low vision, both eyes (H54.2)   Activity Tolerance Patient tolerated treatment well   Patient Left in chair;with call bell/phone within reach;with chair alarm set   Nurse Communication Mobility status        Time: 1115-1130 OT Time Calculation (min): 15 min  Charges: OT General Charges $OT Visit: 1 Visit OT Treatments $Therapeutic Activity: 8-22 mins  Martie Round, OTR/L Acute Rehabilitation Services Pager: [phone] Office: [phone]  Evern Bio 11/23/2017, 1:53 PM

## 2017-11-23 NOTE — Progress Notes (Signed)
Physical Therapy Treatment Patient Details Name: Austin SandiferHarold Baker MRN: [id number] DOB: 10/10/1940 Today's Date: 11/23/2017    History of Present Illness Pt adm with generalized weakness and UTI. PMH - congenital blindness, prior multiple CVAs in 1980s/1990s with residual right sided weakness, urinary tract infection, BPH, chronic kidney disease stage III, hypertension     PT Comments     Pt progressing slowly towards his goals. Session focused on lower extremity strengthening and transfer training. Pt able to achieve upright standing x 2 trials with two person maximal assistance. Unable to take steps due to inability to weight shift onto RLE; pt does have residual right sided weakness from previous CVA. D/c plan remains appropriate.    Follow Up Recommendations  SNF;Supervision/Assistance - 24 hour     Equipment Recommendations  Other (comment)(TBA)    Recommendations for Other Services       Precautions / Restrictions Precautions Precautions: Fall Restrictions Weight Bearing Restrictions: No    Mobility  Bed Mobility               General bed mobility comments: OOB in chair  Transfers Overall transfer level: Needs assistance Equipment used: Rolling walker (2 wheeled);None Transfers: Sit to/from Stand Sit to Stand: Max assist;+2 physical assistance         General transfer comment: On first trial, pt standing with no assistive device and then walker on second trial with maxA + 2 for initiation and cues for leaning forward to gain momentum. Required physical assistance for reciprocal scooting to edge of seat. Able to achieve upright with hip extension with tactile cueing. Attempted to take steps, but pt unable to bring LLE forward due to inability to weight shift onto RLE. Increased right knee hyperextension in stance.   Ambulation/Gait                 Stairs             Wheelchair Mobility    Modified Rankin (Stroke Patients Only)        Balance Overall balance assessment: Needs assistance Sitting-balance support: No upper extremity supported;Feet supported Sitting balance-Leahy Scale: Good     Standing balance support: Bilateral upper extremity supported Standing balance-Leahy Scale: Poor                              Cognition Arousal/Alertness: Awake/alert Behavior During Therapy: WFL for tasks assessed/performed Overall Cognitive Status: No family/caregiver present to determine baseline cognitive functioning Area of Impairment: Following commands;Safety/judgement;Problem solving                       Following Commands: Follows one step commands with increased time Safety/Judgement: Decreased awareness of safety;Decreased awareness of deficits   Problem Solving: Slow processing;Decreased initiation;Difficulty sequencing;Requires verbal cues;Requires tactile cues General Comments: Pt requiring increased time and cues.       Exercises General Exercises - Lower Extremity Long Arc Quad: 15 reps;Both;Seated Hip ABduction/ADduction: 10 reps;Both;Seated Hip Flexion/Marching: 20 reps;Both;Seated Heel Raises: 10 reps;Both;Seated    General Comments        Pertinent Vitals/Pain Pain Assessment: No/denies pain    Home Living                      Prior Function            PT Goals (current goals can now be found in the care plan section) Acute Rehab PT Goals Patient Stated  Goal: go home to his radio Potential to Achieve Goals: Fair Progress towards PT goals: Progressing toward goals    Frequency    Min 3X/week      PT Plan Current plan remains appropriate    Co-evaluation PT/OT/SLP Co-Evaluation/Treatment: Yes Reason for Co-Treatment: For patient/therapist safety;To address functional/ADL transfers PT goals addressed during session: Mobility/safety with mobility        AM-PAC PT "6 Clicks" Daily Activity  Outcome Measure  Difficulty turning over in bed  (including adjusting bedclothes, sheets and blankets)?: Unable Difficulty moving from lying on back to sitting on the side of the bed? : Unable Difficulty sitting down on and standing up from a chair with arms (e.g., wheelchair, bedside commode, etc,.)?: Unable Help needed moving to and from a bed to chair (including a wheelchair)?: A Lot Help needed walking in hospital room?: Total Help needed climbing 3-5 steps with a railing? : Total 6 Click Score: 7    End of Session Equipment Utilized During Treatment: Gait belt Activity Tolerance: Patient tolerated treatment well Patient left: in chair;with call bell/phone within reach;with chair alarm set Nurse Communication: Mobility status PT Visit Diagnosis: Other abnormalities of gait and mobility (R26.89);Muscle weakness (generalized) (M62.81);Difficulty in walking, not elsewhere classified (R26.2)     Time: 1100-1130 PT Time Calculation (min) (ACUTE ONLY): 30 min  Charges:  $Therapeutic Exercise: 8-22 mins                     Laurina Bustle, PT, DPT Acute Rehabilitation Services Pager [phone] Office [phone]    Vanetta Mulders 11/23/2017, 1:25 PM

## 2017-11-23 NOTE — Care Management Important Message (Signed)
Important Message  Patient Details  Name: Austin Baker MRN: [id number] Date of Birth: 01/10/1941   Medicare Important Message Given:  Yes    [name] [name] 11/23/2017, 4:08 PM

## 2017-11-23 NOTE — Progress Notes (Addendum)
[facility] has agreed to SNF placement for her brother. CSW attempt to reach the patient's sister, to confirm decision and inquire about SNF choices. CSW left voice message to return call. CSW will need SNF choice and insurance approval before patient can be admitted to SNF.  Antony Blackbirdynthia Carleah Yablonski, [facility] Clinical Social Worker [phone]

## 2017-11-23 NOTE — Clinical Social Work Note (Signed)
Clinical Social Work Assessment  Patient Details  Name: Austin Baker MRN: [id number] Date of Birth: 07/02/1940  Date of referral:  11/23/17               Reason for consult:  Facility Placement                Permission sought to share information with:  Family Supports, Case Manager Permission granted to share information::     Name::        Agency::     Relationship::     Contact Information:     Housing/Transportation Living arrangements for the past 2 months:  Single Family Home Source of Information:  Patient, Siblings Patient Interpreter Needed:  None Criminal Activity/Legal Involvement Pertinent to Current Situation/Hospitalization:  No - Comment as needed Significant Relationships:  Siblings Lives with:  Siblings Do you feel safe going back to the place where you live?  Yes Need for family participation in patient care:  Yes (Comment)  Care giving concerns:  CSW received consult for discharge needs. CSW spoke with patient regarding PT recommendation of SNF placement at time of discharge. Patient states he lives in the home with his sister and requested CSW discuss his needs with his sister.   Social Worker assessment / plan:  CSW spoke with patient's sister, as requested  [facility]. Patient sister states she will continue to care for her brother in the home and decline SNF.   Employment status:  Disabled (Comment on whether or not currently receiving Disability)(blind) Insurance information:  Medicare PT Recommendations:  Skilled Nursing Facility Information / Referral to community resources:     Patient/Family's Response to care: Patient's family recognizes need for rehab and prefer the patient go home with home health. Patient's sister express that she wants to continue to care for her brother in their home.     Patient/Family's Understanding of and Emotional Response to Diagnosis, Current Treatment, and Prognosis:   Patient's family is realistic regarding therapy needs and requested to have home heath agency assist in meeting the patient's needs. Patient and his family expressed appreciation for CSW assistance. Family states  understanding of CSW role and discharge process as well as medical condition. No questions/concerns about plan or treatment.    Emotional Assessment Appearance:  Appears stated age Attitude/Demeanor/Rapport:  Engaged(cooperative) Affect (typically observed):  Accepting, Calm, Appropriate Orientation:  Oriented to Self, Oriented to Place, Oriented to  Time, Oriented to Situation Alcohol / Substance use:  Not Applicable Psych involvement (Current and /or in the community):  No (Comment)  Discharge Needs  Concerns to be addressed:  Care Coordination Readmission within the last 30 days:  No Current discharge risk:  Dependent with Mobility Barriers to Discharge:  No Barriers Identified   Eduard RouxCynthia N Carlos Baker, LCSWA 11/23/2017, 4:03 PM

## 2017-11-23 NOTE — Progress Notes (Signed)
CSW talked with the patient's sister as requested- the family is refusing SNF. Patient is going home with his sister once he is medically ready for discharge.   CSW is signing off for now for no further social work intervention is needed. RNCM informed.  Antony Blackbirdynthia Chiyeko Ferre, [facility] Clinical Social Worker [phone]

## 2017-11-24 DIAGNOSIS — I10 Essential (primary) hypertension: Secondary | ICD-10-CM

## 2017-11-24 LAB — BASIC METABOLIC PANEL
ANION GAP: 9 (ref 5–15)
BUN: 28 mg/dL — AB (ref 8–23)
CALCIUM: 8.9 mg/dL (ref 8.9–10.3)
CO2: 23 mmol/L (ref 22–32)
Chloride: 108 mmol/L (ref 98–111)
Creatinine, Ser: 1.67 mg/dL — ABNORMAL HIGH (ref 0.61–1.24)
GFR calc Af Amer: 45 mL/min — ABNORMAL LOW (ref >60)
GFR, EST NON AFRICAN AMERICAN: 38 mL/min — AB (ref >60)
GLUCOSE: 116 mg/dL — AB (ref 70–99)
Potassium: 4 mmol/L (ref 3.5–5.1)
SODIUM: 140 mmol/L (ref 135–145)

## 2017-11-24 NOTE — Progress Notes (Signed)
CSW confirmed with the family the patient is going to SNF. They have selected [facility].   Antony Blackbirdynthia Conception Doebler, [facility] Clinical Social Worker [phone]

## 2017-11-24 NOTE — NC FL2 (Signed)
[place] MEDICAID FL2 LEVEL OF CARE SCREENING TOOL     IDENTIFICATION  Patient Name: Austin Baker Birthdate: 11-May-1940 Sex: male Admission Date (Current Location): 11/20/2017  [facility] and [place] Number:  Producer, television/film/video and Address:  [facility], [address] [place], [place] [address]      Provider Number:    Attending Physician Name and Address:  Tyson Alias, *  Relative Name and Phone Number:  Yvone Neu [phone]     Current Level of Care: Hospital Recommended Level of Care: Skilled Nursing Facility Prior Approval Number:    Date Approved/Denied:   PASRR Number: [id number]  Discharge Plan: SNF    Current Diagnoses: Patient Active Problem List   Diagnosis Date Noted  . Physical deconditioning 11/21/2017  . Urinary incontinence 04/04/2015  . Preventative health care 11/26/2013  . Skin tag - left hip 4 cm 07/13/2011  . BPH (benign prostatic hyperplasia) 08/04/2006  . BLINDNESS 12/28/2005  . Essential hypertension 12/28/2005  . CKD (chronic kidney disease) stage 3, GFR 30-59 ml/min (HCC) 12/28/2005    Orientation RESPIRATION BLADDER Height & Weight     Self, Time, Situation, Place  Normal External catheter Weight: 151 lb 14.4 oz (68.9 kg) Height:  6\' 2"  (188 cm)  BEHAVIORAL SYMPTOMS/MOOD NEUROLOGICAL BOWEL NUTRITION STATUS      Continent Diet(please see discharge summary)  AMBULATORY STATUS COMMUNICATION OF NEEDS Skin     Verbally Normal                       Personal Care Assistance Level of Assistance              Functional Limitations Info  Sight, Hearing, Speech(Blind) Sight Info: Impaired Hearing Info: Adequate Speech Info: Adequate    SPECIAL CARE FACTORS FREQUENCY  PT (By licensed PT), OT (By licensed OT)     PT Frequency: 5x per week OT Frequency: 5x per week            Contractures Contractures Info: Not present    Additional Factors Info  Code Status,  Allergies, Insulin Sliding Scale Code Status Info: FULL Allergies Info: NKA   Insulin Sliding Scale Info: lovenox injection 40mg  daily       Current Medications (11/24/2017):  This is the current hospital active medication list Current Facility-Administered Medications  Medication Dose Route Frequency Provider Last Rate Last Dose  . acetaminophen (TYLENOL) tablet 650 mg  650 mg Oral Q6H PRN Arnetha Courser, MD       Or  . acetaminophen (TYLENOL) suppository 650 mg  650 mg Rectal Q6H PRN Arnetha Courser, MD      . amLODipine (NORVASC) tablet 10 mg  10 mg Oral Daily Arnetha Courser, MD   10 mg at 11/23/17 0834  . aspirin chewable tablet 81 mg  81 mg Oral Daily Tyson Alias, MD   81 mg at 11/23/17 4332  . chlorproMAZINE (THORAZINE) tablet 25 mg  25 mg Oral BID Arnetha Courser, MD   25 mg at 11/23/17 2106  . enoxaparin (LOVENOX) injection 40 mg  40 mg Subcutaneous Daily Arnetha Courser, MD   40 mg at 11/23/17 0834  . metoprolol succinate (TOPROL-XL) 24 hr tablet 100 mg  100 mg Oral Daily Arnetha Courser, MD   100 mg at 11/23/17 0832  . polyethylene glycol (MIRALAX / GLYCOLAX) packet 17 g  17 g Oral Daily PRN Arnetha Courser, MD      . quinapril (ACCUPRIL) tablet  40 mg  40 mg Oral Daily Arnetha CourserAmin, Sumayya, MD   40 mg at 11/23/17 16100832     Discharge Medications: Please see discharge summary for a list of discharge medications.  Relevant Imaging Results:  Relevant Lab Results:   Additional Information SSN [id number]  Eduard Rouxynthia N Conlee Sliter, LCSWA

## 2017-11-24 NOTE — Progress Notes (Signed)
   Subjective: [name] reported feeling okay this morning. He said no one had talked to him this morning regarding SNF placement. He was informed we were waiting on SNF placement and this would be a short-term plan.  Objective:  Vital signs in last 24 hours: Vitals:   11/23/17 0742 11/23/17 1918 11/23/17 2300 11/24/17 0337  BP: 112/75 118/78 101/82 126/76  Pulse: 89 88 93 93  Resp: (!) 23 19 17 19   Temp: 98.7 F (37.1 C) 99.9 F (37.7 C) 98.1 F (36.7 C) 98.7 F (37.1 C)  TempSrc: Oral Oral Oral Oral  SpO2: 99%   98%  Weight:      Height:       Physical Exam  Constitutional: He is oriented to person, place, and time. No distress.  Neurological: He is alert and oriented to person, place, and time.    Assessment/Plan:  Principal Problem:   Physical deconditioning  Physical Decondtioning Patient and patient's family requested to home health PT yesterday and were refusing SNF placement at that time. However, later in the day [facility]. Discharge pending on SNF placement.  Hypertension -continue to hold Maxide for nowas he appears dry on physical exam.Can restart if necessary  Hypokalemia K+ 4.0. Resolved -f/u bmp  Dispo: Anticipated discharge pending on SNF placement. Patient is medically stable for discharge.   Jaci StandardRehman, Rilan Eiland N, DO 11/24/2017, 7:41 AM Pager: [phone]

## 2017-11-25 ENCOUNTER — Inpatient Hospital Stay (HOSPITAL_COMMUNITY): Payer: Medicare Other

## 2017-11-25 DIAGNOSIS — R2681 Unsteadiness on feet: Secondary | ICD-10-CM | POA: Diagnosis not present

## 2017-11-25 DIAGNOSIS — M6281 Muscle weakness (generalized): Secondary | ICD-10-CM | POA: Diagnosis not present

## 2017-11-25 DIAGNOSIS — D649 Anemia, unspecified: Secondary | ICD-10-CM | POA: Diagnosis not present

## 2017-11-25 DIAGNOSIS — Z87891 Personal history of nicotine dependence: Secondary | ICD-10-CM | POA: Diagnosis not present

## 2017-11-25 DIAGNOSIS — R2689 Other abnormalities of gait and mobility: Secondary | ICD-10-CM | POA: Diagnosis not present

## 2017-11-25 DIAGNOSIS — R509 Fever, unspecified: Secondary | ICD-10-CM | POA: Diagnosis not present

## 2017-11-25 DIAGNOSIS — I69391 Dysphagia following cerebral infarction: Secondary | ICD-10-CM | POA: Diagnosis not present

## 2017-11-25 DIAGNOSIS — Z7982 Long term (current) use of aspirin: Secondary | ICD-10-CM | POA: Diagnosis not present

## 2017-11-25 DIAGNOSIS — K4091 Unilateral inguinal hernia, without obstruction or gangrene, recurrent: Secondary | ICD-10-CM | POA: Diagnosis not present

## 2017-11-25 DIAGNOSIS — R531 Weakness: Secondary | ICD-10-CM | POA: Diagnosis not present

## 2017-11-25 DIAGNOSIS — M2550 Pain in unspecified joint: Secondary | ICD-10-CM | POA: Diagnosis not present

## 2017-11-25 DIAGNOSIS — I129 Hypertensive chronic kidney disease with stage 1 through stage 4 chronic kidney disease, or unspecified chronic kidney disease: Secondary | ICD-10-CM | POA: Diagnosis not present

## 2017-11-25 DIAGNOSIS — I10 Essential (primary) hypertension: Secondary | ICD-10-CM | POA: Diagnosis not present

## 2017-11-25 DIAGNOSIS — R1311 Dysphagia, oral phase: Secondary | ICD-10-CM | POA: Diagnosis not present

## 2017-11-25 DIAGNOSIS — I69351 Hemiplegia and hemiparesis following cerebral infarction affecting right dominant side: Secondary | ICD-10-CM | POA: Diagnosis not present

## 2017-11-25 DIAGNOSIS — H548 Legal blindness, as defined in USA: Secondary | ICD-10-CM | POA: Diagnosis not present

## 2017-11-25 DIAGNOSIS — R066 Hiccough: Secondary | ICD-10-CM | POA: Diagnosis not present

## 2017-11-25 DIAGNOSIS — I6789 Other cerebrovascular disease: Secondary | ICD-10-CM | POA: Diagnosis not present

## 2017-11-25 DIAGNOSIS — H547 Unspecified visual loss: Secondary | ICD-10-CM | POA: Diagnosis not present

## 2017-11-25 DIAGNOSIS — N183 Chronic kidney disease, stage 3 (moderate): Secondary | ICD-10-CM | POA: Diagnosis not present

## 2017-11-25 DIAGNOSIS — Z7401 Bed confinement status: Secondary | ICD-10-CM | POA: Diagnosis not present

## 2017-11-25 DIAGNOSIS — H409 Unspecified glaucoma: Secondary | ICD-10-CM | POA: Diagnosis not present

## 2017-11-25 DIAGNOSIS — R000 Tachycardia, unspecified: Secondary | ICD-10-CM | POA: Diagnosis not present

## 2017-11-25 DIAGNOSIS — I69398 Other sequelae of cerebral infarction: Secondary | ICD-10-CM | POA: Diagnosis not present

## 2017-11-25 DIAGNOSIS — R5381 Other malaise: Secondary | ICD-10-CM | POA: Diagnosis not present

## 2017-11-25 DIAGNOSIS — Z87440 Personal history of urinary (tract) infections: Secondary | ICD-10-CM | POA: Diagnosis not present

## 2017-11-25 NOTE — Progress Notes (Signed)
Internal Medicine Attending:   I saw and examined the patient. I reviewed the resident's note and I agree with the resident's findings and plan as documented in the resident's note.  Principal Problem:   Physical deconditioning  Stable condition currently. [facility].   Erlinda Honguncan Tytianna Greenley, MD

## 2017-11-25 NOTE — Progress Notes (Signed)
[facility]. All belongings taken with patient (shoes only items found in room) . VSS. A/O. IV removed. Sister called per pt request to inform he was leaving.

## 2017-11-25 NOTE — Progress Notes (Addendum)
2:36 pm SNF now has insurance authorization for patient. Paged MD. CSW to support with discharge.  11:35 am [facility]. CSW to follow.  Abigail ButtsSusan Manjit Bufano, LCSWA [phone]

## 2017-11-25 NOTE — Progress Notes (Signed)
Physical Therapy Treatment Patient Details Name: Austin SandiferHarold Baker MRN: [id number] DOB: 06/06/1940 Today's Date: 11/25/2017    History of Present Illness Pt adm with generalized weakness and UTI. 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 Making progress towards physical therapy goals. Continues to require two person maximal assistance for transfers, but able to take several steps with moderate assistance and multimodal cueing for weight shifting and right posterior knee block. Chair follow utilized. D/c plan remains appropriate.     Follow Up Recommendations  SNF;Supervision/Assistance - 24 hour     Equipment Recommendations  Other (comment)(defer)    Recommendations for Other Services       Precautions / Restrictions Precautions Precautions: Fall Precaution Comments: pt is blind Restrictions Weight Bearing Restrictions: No    Mobility  Bed Mobility               General bed mobility comments: OOB in chair  Transfers Overall transfer level: Needs assistance Equipment used: Rolling walker (2 wheeled) Transfers: Sit to/from Stand Sit to Stand: Max assist;+2 physical assistance         General transfer comment: Pt requiring increased time for reciprocal scooting to edge of recliner. Cues for leaning forward and use of momentum to achieve upright. Requiring maxA + 2 for initiation and lift assist to standing. Signficant posterior lean and right foot blocked to prevent anterior slide. x2 trials of sit to stands.  Ambulation/Gait Ambulation/Gait assistance: +2 safety/equipment;Mod assist Gait Distance (Feet): 3 Feet Assistive device: Rolling walker (2 wheeled) Gait Pattern/deviations: Step-to pattern;Leaning posteriorly;Decreased weight  shift to right Gait velocity: decreased Gait velocity interpretation: <1.31 ft/sec, indicative of household ambulator General Gait Details: Pt able to take several steps with close chair follow. Tactile/verbal cueing provided for weight shifting and posterior knee block to prevent excessive right knee extension.    Stairs             Wheelchair Mobility    Modified Rankin (Stroke Patients Only)       Balance Overall balance assessment: Needs assistance   Sitting balance-Leahy Scale: Fair     Standing balance support: Bilateral upper extremity supported Standing balance-Leahy Scale: Poor Standing balance comment: B UE and external support                            Cognition Arousal/Alertness: Awake/alert Behavior During Therapy: WFL for tasks assessed/performed Overall Cognitive Status: No family/caregiver present to determine baseline cognitive functioning Area of Impairment: Following commands;Safety/judgement;Problem solving                       Following Commands: Follows one step commands with increased time Safety/Judgement: Decreased awareness of safety;Decreased awareness of deficits   Problem Solving: Slow processing;Decreased initiation;Difficulty sequencing;Requires verbal cues;Requires tactile cues General Comments: Pt requiring increased time and cues.       Exercises      General Comments        Pertinent Vitals/Pain Pain Assessment: No/denies pain    Home Living                      Prior Function            PT Goals (current goals can now  be found in the care plan section) Acute Rehab PT Goals Patient Stated Goal: go home to his radio Potential to Achieve Goals: Fair Progress towards PT goals: Progressing toward goals    Frequency    Min 2X/week      PT Plan Frequency needs to be updated    Co-evaluation              AM-PAC PT "6 Clicks" Daily Activity  Outcome Measure  Difficulty  turning over in bed (including adjusting bedclothes, sheets and blankets)?: Unable Difficulty moving from lying on back to sitting on the side of the bed? : Unable Difficulty sitting down on and standing up from a chair with arms (e.g., wheelchair, bedside commode, etc,.)?: Unable Help needed moving to and from a bed to chair (including a wheelchair)?: A Lot Help needed walking in hospital room?: A Lot Help needed climbing 3-5 steps with a railing? : Total 6 Click Score: 8    End of Session Equipment Utilized During Treatment: Gait belt Activity Tolerance: Patient tolerated treatment well Patient left: in chair;with call bell/phone within reach;with chair alarm set Nurse Communication: Mobility status PT Visit Diagnosis: Other abnormalities of gait and mobility (R26.89);Muscle weakness (generalized) (M62.81);Difficulty in walking, not elsewhere classified (R26.2)     Time: 1610-9604 PT Time Calculation (min) (ACUTE ONLY): 19 min  Charges:  $Gait Training: 8-22 mins                     Laurina Bustle, Alex, DPT Acute Rehabilitation Services Pager [phone] Office [phone]   Vanetta Mulders 11/25/2017, 2:10 PM

## 2017-11-25 NOTE — Clinical Social Work Placement (Signed)
   CLINICAL SOCIAL WORK PLACEMENT  NOTE  Date:  11/25/2017  Patient Details  Name: Austin Baker MRN: [id number] Date of Birth: 03/15/1940  Clinical Social Work is seeking post-discharge placement for this patient at the Skilled  Nursing Facility level of care (*CSW will initial, date and re-position this form in  chart as items are completed):  Yes   Patient/family provided with [place] Clinical Social Work Department's list of facilities offering this level of care within the geographic area requested by the patient (or if unable, by the patient's family).  Yes   Patient/family informed of their freedom to choose among providers that offer the needed level of care, that participate in Medicare, Medicaid or managed care program needed by the patient, have an available bed and are willing to accept the patient.  Yes   Patient/family informed of [facility]'s ownership interest in [facility] and [facility], as well as of the fact that they are under no obligation to receive care at these facilities.  PASRR submitted to EDS on 11/24/17     PASRR number received on 11/24/17     Existing PASRR number confirmed on       FL2 transmitted to all facilities in geographic area requested by pt/family on 11/24/17     FL2 transmitted to all facilities within larger geographic area on       Patient informed that his/her managed care company has contracts with or will negotiate with certain facilities, including the following:  [facility]     Yes   Patient/family informed of bed offers received.  [facility]     Physician recommends and patient chooses bed at      [facility] 11/25/17.  Patient to be transferred to facility by [facility]     Patient family notified on 11/25/17 of transfer.  Name of family member notified:        PHYSICIAN Please prepare priority discharge  summary, including medications, Please prepare prescriptions     Additional Comment:    _______________________________________________ Abigail ButtsSusan Pecolia Marando, LCSW 11/25/2017, 2:37 PM

## 2017-11-25 NOTE — Progress Notes (Signed)
   Subjective: No acute events. [name] reported feeling well this morning. Denied any cough, SOB, trouble urinating or pain with urination.   Objective:  Vital signs in last 24 hours: Vitals:   11/24/17 1926 11/24/17 2324 11/25/17 0300 11/25/17 0700  BP: (!) 121/99 (!) 142/84 112/73 (!) 145/86  Pulse:  97 (!) 101   Resp:  19 19   Temp: 99.8 F (37.7 C) (!) 100.6 F (38.1 C) 100.3 F (37.9 C) 98.9 F (37.2 C)  TempSrc: Oral Oral Oral Oral  SpO2:  96% 99%   Weight:      Height:       Physical Exam  Constitutional: He is oriented to person, place, and time. No distress.  Pulmonary/Chest: Effort normal and breath sounds normal. No respiratory distress. He has no wheezes. He has no rales.  Musculoskeletal: He exhibits no edema.  Neurological: He is alert and oriented to person, place, and time.  Skin: He is not diaphoretic.    Assessment/Plan:  Principal Problem:   Physical deconditioning  Physical Decondtioning -[facility]  Fever Patient had a fever 100.6. Denies any cough, SOB, pain with urination.  -f/u chest xray   Dispo: Pending SNF placement.  Jaci StandardRehman, Gohan Collister N, DO 11/25/2017, 9:08 AM Pager: [phone]

## 2017-11-25 NOTE — Progress Notes (Signed)
Internal Medicine Attending:   I saw and examined the patient. I reviewed the resident's note and I agree with the resident's findings and plan as documented in the resident's note.  Principal Problem:   Physical deconditioning  Hospital day #634 for a 77 year old man admitted for increasing weakness and reduced functional status at home.  He has a chronic right sided weakness due to a prior CVA, as well as congenital blindness, he is permanently disabled and relies on his family members for help with his activities of daily living.  CT imaging of his head was normal and our neuro exam revealed no focal neurologic deficits, so a new stroke was ruled out.  The rest of his work-up was also reassuring with no electrolyte abnormalities or medication effects.  Because of his deconditioning physical therapy has recommended subacute rehabilitation to work on his independence, he has been awaiting [facility].    Last night on routine vitals he had an elevated temperature to 38.1 degrees.  This morning he has zero complaints, and no signs of a hospital-acquired infection.  He is at his baseline.  Chest x-ray completed and is normal with no signs of atelectasis or infiltrates.  Plan will be to continue monitoring, but I do not think we need to do any further testing at this time.  Patient is medically stable for discharge to subacute rehab when bed available and insurance authorizes.  Dr. Cyndie ChimeGranfortuna will replace me as attending physician tomorrow if the patient remains in-house.  Erlinda Honguncan Vincent, MD

## 2017-11-25 NOTE — Plan of Care (Signed)
Pt. Is progressing. Continue with plan of care. 

## 2017-11-25 NOTE — Progress Notes (Signed)
Patient will discharge [facility] Anticipated discharge date: 11/25/17 Family notified: Yvone NeuMargaret Colvin, sister (left message) Transportation by: [facility]  Nurse to call report to [phone]. Patient will go to room 216 at the facility.  CSW signing off.  Abigail ButtsSusan Teosha Casso, LCSWA  Clinical Social Worker

## 2017-11-25 NOTE — Progress Notes (Signed)
Report called to Doctor PhillipsShanna, LPN [facility]. Awaiting [facility] pick up. DC summary in packet for facility. PIV to be removed.

## 2017-11-28 ENCOUNTER — Encounter: Payer: Self-pay | Admitting: Internal Medicine

## 2017-11-28 ENCOUNTER — Non-Acute Institutional Stay (SKILLED_NURSING_FACILITY): Payer: Medicare Other | Admitting: Internal Medicine

## 2017-11-28 DIAGNOSIS — D649 Anemia, unspecified: Secondary | ICD-10-CM

## 2017-11-28 DIAGNOSIS — R5381 Other malaise: Secondary | ICD-10-CM

## 2017-11-28 DIAGNOSIS — N183 Chronic kidney disease, stage 3 unspecified: Secondary | ICD-10-CM

## 2017-11-28 DIAGNOSIS — I10 Essential (primary) hypertension: Secondary | ICD-10-CM

## 2017-11-28 NOTE — Assessment & Plan Note (Addendum)
Normochromic normocytic anemia; probably related to CKD Monitor for bleeding dyscrasias

## 2017-11-28 NOTE — Assessment & Plan Note (Signed)
PT/OT at the SNF 

## 2017-11-28 NOTE — Assessment & Plan Note (Signed)
Predischarge creatinine 1.67 and GFR 45 He is on 5 antihypertensive medications including triamterene/HCTZ, and Accupril.  Consideration will be given to medication adjustment

## 2017-11-28 NOTE — Patient Instructions (Signed)
See assessment and plan under each diagnosis in the problem list and acutely for this visit 

## 2017-11-28 NOTE — Progress Notes (Signed)
NURSING HOME LOCATION:  [facility] ROOM NUMBER:  [id number]  CODE STATUS:  Full Code  PCP:  Yvette Rack, MD  [address] [place] [address]  This is a comprehensive admission note to [facility] performed on this date less than 30 days from date of admission. Included are preadmission medical/surgical history; reconciled medication list; family history; social history and comprehensive review of systems.  Corrections and additions to the records were documented. Comprehensive physical exam was also performed. Additionally a clinical summary was entered for each active diagnosis pertinent to this admission in the Problem List to enhance continuity of care.  HPI: The patient was hospitalized 11/10-11/15/2019 for physical deconditioning, adult failure to thrive.  He presented with overall generalized weakness.  His baseline was ambulating with a cane according to his sister who is his caregiver. Over the 3 days prior to admission he required assistance with ambulation and bathroom activities.  The night prior to discharge he did have a fever but there were no active signs or symptoms of active infection.  Chest x-ray revealed no infiltrates or atelectasis.   He had mild hyperglycemia as well as elevated creatinine with a value of 1.67 and GFR 45.  Normochromic, normocytic anemia was present with hemoglobin 11.1 and hematocrit 35.1. PT/OT recommended that he receive further therapy at SNF rather than returning home.  Past medical and surgical history: Includes history of prior strokes,hx urosepsis, renal insufficiency, legal blindness,  and BPH.He has essential hypertension and is on 5 antihypertensive medications. He had hernia repair as a child as well as inguinal hernia repair in 2013.  He has also had glaucoma surgery.  Social history: Nondrinker; former smoker, stopping in 1972.  Family history: limited history reviewed   Review of systems:  Could not be  completed due to probable dementia.  He stated that he was blind & did not "keep up with everything".  He seems to indicate that he listens to radio but could not give me the date or the name of the president.  He could not give me his PCP's name.  He then stated "no use in complaining".  Every question was answered " no" with admonition to ask his sister for any pertinent medical information.  He categorically denied any specific complaints.  Physical exam:  Pertinent or positive findings: He has pattern alopecia.  He keeps his eyes closed.  He has very few remaining teeth and these are plaque encrusted.  Pedal pulses are decreased.  He is diffusely weak especially in the lower extremities.  He has an intermittent tremor of the left hand with somewhat of a pill-rolling character.  General appearance: Adequately nourished; no acute distress, increased work of breathing is present.   Lymphatic: No lymphadenopathy about the head, neck, axilla. Ears:  External ear exam shows no significant lesions or deformities.   Nose:  External nasal examination shows no deformity or inflammation. Nasal mucosa are pink and moist without lesions, exudates Neck:  No thyromegaly, masses, tenderness noted.    Heart:  Normal rate and regular rhythm. S1 and S2 normal without gallop, murmur, click, rub.  Lungs: Chest clear to auscultation without wheezes, rhonchi, rales, rubs. Abdomen: Bowel sounds are normal.  Abdomen is soft and nontender with no organomegaly, hernias, masses. GU: Deferred  Extremities:  No cyanosis, clubbing, edema. Neurologic exam: Balance, Rhomberg, finger to nose testing could not be completed due to clinical state Skin: Warm & dry w/o tenting. No significant  lesions or rash.  See clinical summary under each active problem in the Problem List with associated updated therapeutic plan

## 2017-11-28 NOTE — Assessment & Plan Note (Signed)
Blood pressure is controlled at this time but he is on 5 antihypertensive medications.  This will need to be monitored and assessed

## 2017-11-30 ENCOUNTER — Ambulatory Visit: Payer: Medicare Other

## 2017-12-01 LAB — HEPATIC FUNCTION PANEL
ALT: 77 — AB (ref 10–40)
AST: 39 (ref 14–40)
Alkaline Phosphatase: 94 (ref 25–125)

## 2017-12-01 LAB — BASIC METABOLIC PANEL
BUN: 36 — AB (ref 4–21)
BUN: 36 — AB (ref 4–21)
CREATININE: 1.3 (ref 0.6–1.3)
CREATININE: 1.3 (ref 0.6–1.3)
Glucose: 107
POTASSIUM: 3.6 (ref 3.4–5.3)
Sodium: 142 (ref 137–147)

## 2017-12-01 LAB — VITAMIN D 25 HYDROXY (VIT D DEFICIENCY, FRACTURES): VIT D 25 HYDROXY: 0.3

## 2017-12-16 ENCOUNTER — Encounter: Payer: Medicare Other | Admitting: Internal Medicine

## 2017-12-16 DIAGNOSIS — Z7902 Long term (current) use of antithrombotics/antiplatelets: Secondary | ICD-10-CM | POA: Diagnosis not present

## 2017-12-16 DIAGNOSIS — R1311 Dysphagia, oral phase: Secondary | ICD-10-CM | POA: Diagnosis not present

## 2017-12-16 DIAGNOSIS — I10 Essential (primary) hypertension: Secondary | ICD-10-CM | POA: Diagnosis not present

## 2017-12-16 DIAGNOSIS — I69315 Cognitive social or emotional deficit following cerebral infarction: Secondary | ICD-10-CM | POA: Diagnosis not present

## 2017-12-16 DIAGNOSIS — I129 Hypertensive chronic kidney disease with stage 1 through stage 4 chronic kidney disease, or unspecified chronic kidney disease: Secondary | ICD-10-CM | POA: Diagnosis not present

## 2017-12-16 DIAGNOSIS — N183 Chronic kidney disease, stage 3 (moderate): Secondary | ICD-10-CM | POA: Diagnosis not present

## 2017-12-16 DIAGNOSIS — H548 Legal blindness, as defined in USA: Secondary | ICD-10-CM | POA: Diagnosis not present

## 2017-12-16 DIAGNOSIS — M6281 Muscle weakness (generalized): Secondary | ICD-10-CM | POA: Diagnosis not present

## 2017-12-16 DIAGNOSIS — Z9181 History of falling: Secondary | ICD-10-CM | POA: Diagnosis not present

## 2017-12-16 DIAGNOSIS — Z87891 Personal history of nicotine dependence: Secondary | ICD-10-CM | POA: Diagnosis not present

## 2017-12-16 DIAGNOSIS — Z8673 Personal history of transient ischemic attack (TIA), and cerebral infarction without residual deficits: Secondary | ICD-10-CM | POA: Diagnosis not present

## 2017-12-16 DIAGNOSIS — Z87440 Personal history of urinary (tract) infections: Secondary | ICD-10-CM | POA: Diagnosis not present

## 2017-12-16 DIAGNOSIS — H409 Unspecified glaucoma: Secondary | ICD-10-CM | POA: Diagnosis not present

## 2017-12-16 DIAGNOSIS — I69391 Dysphagia following cerebral infarction: Secondary | ICD-10-CM | POA: Diagnosis not present

## 2017-12-21 DIAGNOSIS — Z87891 Personal history of nicotine dependence: Secondary | ICD-10-CM | POA: Diagnosis not present

## 2017-12-21 DIAGNOSIS — I69391 Dysphagia following cerebral infarction: Secondary | ICD-10-CM | POA: Diagnosis not present

## 2017-12-21 DIAGNOSIS — N183 Chronic kidney disease, stage 3 (moderate): Secondary | ICD-10-CM | POA: Diagnosis not present

## 2017-12-21 DIAGNOSIS — I69315 Cognitive social or emotional deficit following cerebral infarction: Secondary | ICD-10-CM | POA: Diagnosis not present

## 2017-12-21 DIAGNOSIS — H548 Legal blindness, as defined in USA: Secondary | ICD-10-CM | POA: Diagnosis not present

## 2017-12-21 DIAGNOSIS — H409 Unspecified glaucoma: Secondary | ICD-10-CM | POA: Diagnosis not present

## 2017-12-21 DIAGNOSIS — Z9181 History of falling: Secondary | ICD-10-CM | POA: Diagnosis not present

## 2017-12-21 DIAGNOSIS — Z87440 Personal history of urinary (tract) infections: Secondary | ICD-10-CM | POA: Diagnosis not present

## 2017-12-21 DIAGNOSIS — I129 Hypertensive chronic kidney disease with stage 1 through stage 4 chronic kidney disease, or unspecified chronic kidney disease: Secondary | ICD-10-CM | POA: Diagnosis not present

## 2017-12-21 DIAGNOSIS — R1311 Dysphagia, oral phase: Secondary | ICD-10-CM | POA: Diagnosis not present

## 2017-12-23 DIAGNOSIS — I69391 Dysphagia following cerebral infarction: Secondary | ICD-10-CM | POA: Diagnosis not present

## 2017-12-23 DIAGNOSIS — Z87891 Personal history of nicotine dependence: Secondary | ICD-10-CM | POA: Diagnosis not present

## 2017-12-23 DIAGNOSIS — N183 Chronic kidney disease, stage 3 (moderate): Secondary | ICD-10-CM | POA: Diagnosis not present

## 2017-12-23 DIAGNOSIS — H409 Unspecified glaucoma: Secondary | ICD-10-CM | POA: Diagnosis not present

## 2017-12-23 DIAGNOSIS — I129 Hypertensive chronic kidney disease with stage 1 through stage 4 chronic kidney disease, or unspecified chronic kidney disease: Secondary | ICD-10-CM | POA: Diagnosis not present

## 2017-12-23 DIAGNOSIS — H548 Legal blindness, as defined in USA: Secondary | ICD-10-CM | POA: Diagnosis not present

## 2017-12-23 DIAGNOSIS — I69315 Cognitive social or emotional deficit following cerebral infarction: Secondary | ICD-10-CM | POA: Diagnosis not present

## 2017-12-23 DIAGNOSIS — Z87440 Personal history of urinary (tract) infections: Secondary | ICD-10-CM | POA: Diagnosis not present

## 2017-12-23 DIAGNOSIS — Z9181 History of falling: Secondary | ICD-10-CM | POA: Diagnosis not present

## 2017-12-23 DIAGNOSIS — R1311 Dysphagia, oral phase: Secondary | ICD-10-CM | POA: Diagnosis not present

## 2017-12-26 DIAGNOSIS — H548 Legal blindness, as defined in USA: Secondary | ICD-10-CM | POA: Diagnosis not present

## 2017-12-26 DIAGNOSIS — I69315 Cognitive social or emotional deficit following cerebral infarction: Secondary | ICD-10-CM | POA: Diagnosis not present

## 2017-12-26 DIAGNOSIS — I69391 Dysphagia following cerebral infarction: Secondary | ICD-10-CM | POA: Diagnosis not present

## 2017-12-26 DIAGNOSIS — I129 Hypertensive chronic kidney disease with stage 1 through stage 4 chronic kidney disease, or unspecified chronic kidney disease: Secondary | ICD-10-CM | POA: Diagnosis not present

## 2017-12-26 DIAGNOSIS — Z9181 History of falling: Secondary | ICD-10-CM | POA: Diagnosis not present

## 2017-12-26 DIAGNOSIS — Z87440 Personal history of urinary (tract) infections: Secondary | ICD-10-CM | POA: Diagnosis not present

## 2017-12-26 DIAGNOSIS — H409 Unspecified glaucoma: Secondary | ICD-10-CM | POA: Diagnosis not present

## 2017-12-26 DIAGNOSIS — R1311 Dysphagia, oral phase: Secondary | ICD-10-CM | POA: Diagnosis not present

## 2017-12-26 DIAGNOSIS — N183 Chronic kidney disease, stage 3 (moderate): Secondary | ICD-10-CM | POA: Diagnosis not present

## 2017-12-26 DIAGNOSIS — Z87891 Personal history of nicotine dependence: Secondary | ICD-10-CM | POA: Diagnosis not present

## 2017-12-28 DIAGNOSIS — N183 Chronic kidney disease, stage 3 (moderate): Secondary | ICD-10-CM | POA: Diagnosis not present

## 2017-12-28 DIAGNOSIS — I69315 Cognitive social or emotional deficit following cerebral infarction: Secondary | ICD-10-CM | POA: Diagnosis not present

## 2017-12-28 DIAGNOSIS — Z87440 Personal history of urinary (tract) infections: Secondary | ICD-10-CM | POA: Diagnosis not present

## 2017-12-28 DIAGNOSIS — H548 Legal blindness, as defined in USA: Secondary | ICD-10-CM | POA: Diagnosis not present

## 2017-12-28 DIAGNOSIS — Z87891 Personal history of nicotine dependence: Secondary | ICD-10-CM | POA: Diagnosis not present

## 2017-12-28 DIAGNOSIS — R1311 Dysphagia, oral phase: Secondary | ICD-10-CM | POA: Diagnosis not present

## 2017-12-28 DIAGNOSIS — H409 Unspecified glaucoma: Secondary | ICD-10-CM | POA: Diagnosis not present

## 2017-12-28 DIAGNOSIS — I69391 Dysphagia following cerebral infarction: Secondary | ICD-10-CM | POA: Diagnosis not present

## 2017-12-28 DIAGNOSIS — Z9181 History of falling: Secondary | ICD-10-CM | POA: Diagnosis not present

## 2017-12-28 DIAGNOSIS — I129 Hypertensive chronic kidney disease with stage 1 through stage 4 chronic kidney disease, or unspecified chronic kidney disease: Secondary | ICD-10-CM | POA: Diagnosis not present

## 2017-12-30 DIAGNOSIS — I69315 Cognitive social or emotional deficit following cerebral infarction: Secondary | ICD-10-CM | POA: Diagnosis not present

## 2017-12-30 DIAGNOSIS — I69391 Dysphagia following cerebral infarction: Secondary | ICD-10-CM | POA: Diagnosis not present

## 2017-12-30 DIAGNOSIS — N183 Chronic kidney disease, stage 3 (moderate): Secondary | ICD-10-CM | POA: Diagnosis not present

## 2017-12-30 DIAGNOSIS — H409 Unspecified glaucoma: Secondary | ICD-10-CM | POA: Diagnosis not present

## 2017-12-30 DIAGNOSIS — H548 Legal blindness, as defined in USA: Secondary | ICD-10-CM | POA: Diagnosis not present

## 2017-12-30 DIAGNOSIS — I129 Hypertensive chronic kidney disease with stage 1 through stage 4 chronic kidney disease, or unspecified chronic kidney disease: Secondary | ICD-10-CM | POA: Diagnosis not present

## 2017-12-30 DIAGNOSIS — Z9181 History of falling: Secondary | ICD-10-CM | POA: Diagnosis not present

## 2017-12-30 DIAGNOSIS — Z87891 Personal history of nicotine dependence: Secondary | ICD-10-CM | POA: Diagnosis not present

## 2017-12-30 DIAGNOSIS — R1311 Dysphagia, oral phase: Secondary | ICD-10-CM | POA: Diagnosis not present

## 2017-12-30 DIAGNOSIS — Z87440 Personal history of urinary (tract) infections: Secondary | ICD-10-CM | POA: Diagnosis not present

## 2018-01-02 DIAGNOSIS — I129 Hypertensive chronic kidney disease with stage 1 through stage 4 chronic kidney disease, or unspecified chronic kidney disease: Secondary | ICD-10-CM | POA: Diagnosis not present

## 2018-01-02 DIAGNOSIS — Z87440 Personal history of urinary (tract) infections: Secondary | ICD-10-CM | POA: Diagnosis not present

## 2018-01-02 DIAGNOSIS — I69315 Cognitive social or emotional deficit following cerebral infarction: Secondary | ICD-10-CM | POA: Diagnosis not present

## 2018-01-02 DIAGNOSIS — N183 Chronic kidney disease, stage 3 (moderate): Secondary | ICD-10-CM | POA: Diagnosis not present

## 2018-01-02 DIAGNOSIS — H548 Legal blindness, as defined in USA: Secondary | ICD-10-CM | POA: Diagnosis not present

## 2018-01-02 DIAGNOSIS — R1311 Dysphagia, oral phase: Secondary | ICD-10-CM | POA: Diagnosis not present

## 2018-01-02 DIAGNOSIS — H409 Unspecified glaucoma: Secondary | ICD-10-CM | POA: Diagnosis not present

## 2018-01-02 DIAGNOSIS — Z9181 History of falling: Secondary | ICD-10-CM | POA: Diagnosis not present

## 2018-01-02 DIAGNOSIS — I69391 Dysphagia following cerebral infarction: Secondary | ICD-10-CM | POA: Diagnosis not present

## 2018-01-02 DIAGNOSIS — Z87891 Personal history of nicotine dependence: Secondary | ICD-10-CM | POA: Diagnosis not present

## 2018-01-06 DIAGNOSIS — Z9181 History of falling: Secondary | ICD-10-CM | POA: Diagnosis not present

## 2018-01-06 DIAGNOSIS — N183 Chronic kidney disease, stage 3 (moderate): Secondary | ICD-10-CM | POA: Diagnosis not present

## 2018-01-06 DIAGNOSIS — H409 Unspecified glaucoma: Secondary | ICD-10-CM | POA: Diagnosis not present

## 2018-01-06 DIAGNOSIS — Z87440 Personal history of urinary (tract) infections: Secondary | ICD-10-CM | POA: Diagnosis not present

## 2018-01-06 DIAGNOSIS — I69391 Dysphagia following cerebral infarction: Secondary | ICD-10-CM | POA: Diagnosis not present

## 2018-01-06 DIAGNOSIS — Z87891 Personal history of nicotine dependence: Secondary | ICD-10-CM | POA: Diagnosis not present

## 2018-01-06 DIAGNOSIS — H548 Legal blindness, as defined in USA: Secondary | ICD-10-CM | POA: Diagnosis not present

## 2018-01-06 DIAGNOSIS — I129 Hypertensive chronic kidney disease with stage 1 through stage 4 chronic kidney disease, or unspecified chronic kidney disease: Secondary | ICD-10-CM | POA: Diagnosis not present

## 2018-01-06 DIAGNOSIS — I69315 Cognitive social or emotional deficit following cerebral infarction: Secondary | ICD-10-CM | POA: Diagnosis not present

## 2018-01-06 DIAGNOSIS — R1311 Dysphagia, oral phase: Secondary | ICD-10-CM | POA: Diagnosis not present

## 2018-01-09 DIAGNOSIS — N183 Chronic kidney disease, stage 3 (moderate): Secondary | ICD-10-CM | POA: Diagnosis not present

## 2018-01-09 DIAGNOSIS — I129 Hypertensive chronic kidney disease with stage 1 through stage 4 chronic kidney disease, or unspecified chronic kidney disease: Secondary | ICD-10-CM | POA: Diagnosis not present

## 2018-01-09 DIAGNOSIS — Z87891 Personal history of nicotine dependence: Secondary | ICD-10-CM | POA: Diagnosis not present

## 2018-01-09 DIAGNOSIS — H548 Legal blindness, as defined in USA: Secondary | ICD-10-CM | POA: Diagnosis not present

## 2018-01-09 DIAGNOSIS — Z9181 History of falling: Secondary | ICD-10-CM | POA: Diagnosis not present

## 2018-01-09 DIAGNOSIS — R1311 Dysphagia, oral phase: Secondary | ICD-10-CM | POA: Diagnosis not present

## 2018-01-09 DIAGNOSIS — I69391 Dysphagia following cerebral infarction: Secondary | ICD-10-CM | POA: Diagnosis not present

## 2018-01-09 DIAGNOSIS — I69315 Cognitive social or emotional deficit following cerebral infarction: Secondary | ICD-10-CM | POA: Diagnosis not present

## 2018-01-09 DIAGNOSIS — Z87440 Personal history of urinary (tract) infections: Secondary | ICD-10-CM | POA: Diagnosis not present

## 2018-01-09 DIAGNOSIS — H409 Unspecified glaucoma: Secondary | ICD-10-CM | POA: Diagnosis not present

## 2018-01-13 DIAGNOSIS — I129 Hypertensive chronic kidney disease with stage 1 through stage 4 chronic kidney disease, or unspecified chronic kidney disease: Secondary | ICD-10-CM | POA: Diagnosis not present

## 2018-01-13 DIAGNOSIS — Z87440 Personal history of urinary (tract) infections: Secondary | ICD-10-CM | POA: Diagnosis not present

## 2018-01-13 DIAGNOSIS — I69391 Dysphagia following cerebral infarction: Secondary | ICD-10-CM | POA: Diagnosis not present

## 2018-01-13 DIAGNOSIS — Z87891 Personal history of nicotine dependence: Secondary | ICD-10-CM | POA: Diagnosis not present

## 2018-01-13 DIAGNOSIS — N183 Chronic kidney disease, stage 3 (moderate): Secondary | ICD-10-CM | POA: Diagnosis not present

## 2018-01-13 DIAGNOSIS — H409 Unspecified glaucoma: Secondary | ICD-10-CM | POA: Diagnosis not present

## 2018-01-13 DIAGNOSIS — R1311 Dysphagia, oral phase: Secondary | ICD-10-CM | POA: Diagnosis not present

## 2018-01-13 DIAGNOSIS — H548 Legal blindness, as defined in USA: Secondary | ICD-10-CM | POA: Diagnosis not present

## 2018-01-13 DIAGNOSIS — I69315 Cognitive social or emotional deficit following cerebral infarction: Secondary | ICD-10-CM | POA: Diagnosis not present

## 2018-01-13 DIAGNOSIS — Z9181 History of falling: Secondary | ICD-10-CM | POA: Diagnosis not present

## 2018-01-16 DIAGNOSIS — N183 Chronic kidney disease, stage 3 (moderate): Secondary | ICD-10-CM | POA: Diagnosis not present

## 2018-01-16 DIAGNOSIS — R1311 Dysphagia, oral phase: Secondary | ICD-10-CM | POA: Diagnosis not present

## 2018-01-16 DIAGNOSIS — I69391 Dysphagia following cerebral infarction: Secondary | ICD-10-CM | POA: Diagnosis not present

## 2018-01-16 DIAGNOSIS — Z87440 Personal history of urinary (tract) infections: Secondary | ICD-10-CM | POA: Diagnosis not present

## 2018-01-16 DIAGNOSIS — I69315 Cognitive social or emotional deficit following cerebral infarction: Secondary | ICD-10-CM | POA: Diagnosis not present

## 2018-01-16 DIAGNOSIS — Z9181 History of falling: Secondary | ICD-10-CM | POA: Diagnosis not present

## 2018-01-16 DIAGNOSIS — Z87891 Personal history of nicotine dependence: Secondary | ICD-10-CM | POA: Diagnosis not present

## 2018-01-16 DIAGNOSIS — H409 Unspecified glaucoma: Secondary | ICD-10-CM | POA: Diagnosis not present

## 2018-01-16 DIAGNOSIS — I129 Hypertensive chronic kidney disease with stage 1 through stage 4 chronic kidney disease, or unspecified chronic kidney disease: Secondary | ICD-10-CM | POA: Diagnosis not present

## 2018-01-16 DIAGNOSIS — H548 Legal blindness, as defined in USA: Secondary | ICD-10-CM | POA: Diagnosis not present

## 2018-01-20 DIAGNOSIS — I69391 Dysphagia following cerebral infarction: Secondary | ICD-10-CM | POA: Diagnosis not present

## 2018-01-20 DIAGNOSIS — R1311 Dysphagia, oral phase: Secondary | ICD-10-CM | POA: Diagnosis not present

## 2018-01-20 DIAGNOSIS — Z9181 History of falling: Secondary | ICD-10-CM | POA: Diagnosis not present

## 2018-01-20 DIAGNOSIS — N183 Chronic kidney disease, stage 3 (moderate): Secondary | ICD-10-CM | POA: Diagnosis not present

## 2018-01-20 DIAGNOSIS — H409 Unspecified glaucoma: Secondary | ICD-10-CM | POA: Diagnosis not present

## 2018-01-20 DIAGNOSIS — I69315 Cognitive social or emotional deficit following cerebral infarction: Secondary | ICD-10-CM | POA: Diagnosis not present

## 2018-01-20 DIAGNOSIS — I129 Hypertensive chronic kidney disease with stage 1 through stage 4 chronic kidney disease, or unspecified chronic kidney disease: Secondary | ICD-10-CM | POA: Diagnosis not present

## 2018-01-20 DIAGNOSIS — H548 Legal blindness, as defined in USA: Secondary | ICD-10-CM | POA: Diagnosis not present

## 2018-01-20 DIAGNOSIS — Z87891 Personal history of nicotine dependence: Secondary | ICD-10-CM | POA: Diagnosis not present

## 2018-01-20 DIAGNOSIS — Z87440 Personal history of urinary (tract) infections: Secondary | ICD-10-CM | POA: Diagnosis not present

## 2018-01-25 DIAGNOSIS — I69391 Dysphagia following cerebral infarction: Secondary | ICD-10-CM | POA: Diagnosis not present

## 2018-01-25 DIAGNOSIS — Z9181 History of falling: Secondary | ICD-10-CM | POA: Diagnosis not present

## 2018-01-25 DIAGNOSIS — I129 Hypertensive chronic kidney disease with stage 1 through stage 4 chronic kidney disease, or unspecified chronic kidney disease: Secondary | ICD-10-CM | POA: Diagnosis not present

## 2018-01-25 DIAGNOSIS — H548 Legal blindness, as defined in USA: Secondary | ICD-10-CM | POA: Diagnosis not present

## 2018-01-25 DIAGNOSIS — Z87891 Personal history of nicotine dependence: Secondary | ICD-10-CM | POA: Diagnosis not present

## 2018-01-25 DIAGNOSIS — I69315 Cognitive social or emotional deficit following cerebral infarction: Secondary | ICD-10-CM | POA: Diagnosis not present

## 2018-01-25 DIAGNOSIS — R1311 Dysphagia, oral phase: Secondary | ICD-10-CM | POA: Diagnosis not present

## 2018-01-25 DIAGNOSIS — Z87440 Personal history of urinary (tract) infections: Secondary | ICD-10-CM | POA: Diagnosis not present

## 2018-01-25 DIAGNOSIS — N183 Chronic kidney disease, stage 3 (moderate): Secondary | ICD-10-CM | POA: Diagnosis not present

## 2018-01-25 DIAGNOSIS — H409 Unspecified glaucoma: Secondary | ICD-10-CM | POA: Diagnosis not present

## 2018-02-03 DIAGNOSIS — I69315 Cognitive social or emotional deficit following cerebral infarction: Secondary | ICD-10-CM | POA: Diagnosis not present

## 2018-02-03 DIAGNOSIS — N183 Chronic kidney disease, stage 3 (moderate): Secondary | ICD-10-CM | POA: Diagnosis not present

## 2018-02-03 DIAGNOSIS — Z87891 Personal history of nicotine dependence: Secondary | ICD-10-CM | POA: Diagnosis not present

## 2018-02-03 DIAGNOSIS — Z9181 History of falling: Secondary | ICD-10-CM | POA: Diagnosis not present

## 2018-02-03 DIAGNOSIS — H548 Legal blindness, as defined in USA: Secondary | ICD-10-CM | POA: Diagnosis not present

## 2018-02-03 DIAGNOSIS — H409 Unspecified glaucoma: Secondary | ICD-10-CM | POA: Diagnosis not present

## 2018-02-03 DIAGNOSIS — R1311 Dysphagia, oral phase: Secondary | ICD-10-CM | POA: Diagnosis not present

## 2018-02-03 DIAGNOSIS — Z87440 Personal history of urinary (tract) infections: Secondary | ICD-10-CM | POA: Diagnosis not present

## 2018-02-03 DIAGNOSIS — I69391 Dysphagia following cerebral infarction: Secondary | ICD-10-CM | POA: Diagnosis not present

## 2018-02-03 DIAGNOSIS — I129 Hypertensive chronic kidney disease with stage 1 through stage 4 chronic kidney disease, or unspecified chronic kidney disease: Secondary | ICD-10-CM | POA: Diagnosis not present

## 2018-02-08 DIAGNOSIS — H548 Legal blindness, as defined in USA: Secondary | ICD-10-CM | POA: Diagnosis not present

## 2018-02-08 DIAGNOSIS — Z9181 History of falling: Secondary | ICD-10-CM | POA: Diagnosis not present

## 2018-02-08 DIAGNOSIS — Z87440 Personal history of urinary (tract) infections: Secondary | ICD-10-CM | POA: Diagnosis not present

## 2018-02-08 DIAGNOSIS — N183 Chronic kidney disease, stage 3 (moderate): Secondary | ICD-10-CM | POA: Diagnosis not present

## 2018-02-08 DIAGNOSIS — I69391 Dysphagia following cerebral infarction: Secondary | ICD-10-CM | POA: Diagnosis not present

## 2018-02-08 DIAGNOSIS — I69315 Cognitive social or emotional deficit following cerebral infarction: Secondary | ICD-10-CM | POA: Diagnosis not present

## 2018-02-08 DIAGNOSIS — R1311 Dysphagia, oral phase: Secondary | ICD-10-CM | POA: Diagnosis not present

## 2018-02-08 DIAGNOSIS — H409 Unspecified glaucoma: Secondary | ICD-10-CM | POA: Diagnosis not present

## 2018-02-08 DIAGNOSIS — Z87891 Personal history of nicotine dependence: Secondary | ICD-10-CM | POA: Diagnosis not present

## 2018-02-08 DIAGNOSIS — I129 Hypertensive chronic kidney disease with stage 1 through stage 4 chronic kidney disease, or unspecified chronic kidney disease: Secondary | ICD-10-CM | POA: Diagnosis not present

## 2018-02-09 DIAGNOSIS — I10 Essential (primary) hypertension: Secondary | ICD-10-CM | POA: Diagnosis not present

## 2018-02-09 DIAGNOSIS — Z8673 Personal history of transient ischemic attack (TIA), and cerebral infarction without residual deficits: Secondary | ICD-10-CM | POA: Diagnosis not present

## 2018-02-09 DIAGNOSIS — N183 Chronic kidney disease, stage 3 (moderate): Secondary | ICD-10-CM | POA: Diagnosis not present

## 2018-02-09 DIAGNOSIS — M6281 Muscle weakness (generalized): Secondary | ICD-10-CM | POA: Diagnosis not present

## 2018-02-09 DIAGNOSIS — Z7902 Long term (current) use of antithrombotics/antiplatelets: Secondary | ICD-10-CM | POA: Diagnosis not present

## 2018-02-10 DIAGNOSIS — Z87440 Personal history of urinary (tract) infections: Secondary | ICD-10-CM | POA: Diagnosis not present

## 2018-02-10 DIAGNOSIS — I69391 Dysphagia following cerebral infarction: Secondary | ICD-10-CM | POA: Diagnosis not present

## 2018-02-10 DIAGNOSIS — Z87891 Personal history of nicotine dependence: Secondary | ICD-10-CM | POA: Diagnosis not present

## 2018-02-10 DIAGNOSIS — I10 Essential (primary) hypertension: Secondary | ICD-10-CM | POA: Diagnosis not present

## 2018-02-10 DIAGNOSIS — H409 Unspecified glaucoma: Secondary | ICD-10-CM | POA: Diagnosis not present

## 2018-02-10 DIAGNOSIS — R1311 Dysphagia, oral phase: Secondary | ICD-10-CM | POA: Diagnosis not present

## 2018-02-10 DIAGNOSIS — I69315 Cognitive social or emotional deficit following cerebral infarction: Secondary | ICD-10-CM | POA: Diagnosis not present

## 2018-02-10 DIAGNOSIS — N183 Chronic kidney disease, stage 3 (moderate): Secondary | ICD-10-CM | POA: Diagnosis not present

## 2018-02-10 DIAGNOSIS — I129 Hypertensive chronic kidney disease with stage 1 through stage 4 chronic kidney disease, or unspecified chronic kidney disease: Secondary | ICD-10-CM | POA: Diagnosis not present

## 2018-02-10 DIAGNOSIS — H548 Legal blindness, as defined in USA: Secondary | ICD-10-CM | POA: Diagnosis not present

## 2018-02-10 DIAGNOSIS — Z9181 History of falling: Secondary | ICD-10-CM | POA: Diagnosis not present

## 2018-02-27 ENCOUNTER — Emergency Department (HOSPITAL_COMMUNITY)
Admission: EM | Admit: 2018-02-27 | Discharge: 2018-02-27 | Disposition: A | Discharge status: Home / Self Care | Payer: Medicare Other | Attending: Emergency Medicine | Admitting: Emergency Medicine

## 2018-02-27 ENCOUNTER — Emergency Department (HOSPITAL_COMMUNITY): Payer: Medicare Other

## 2018-02-27 ENCOUNTER — Other Ambulatory Visit: Payer: Self-pay

## 2018-02-27 DIAGNOSIS — Z79899 Other long term (current) drug therapy: Secondary | ICD-10-CM | POA: Diagnosis not present

## 2018-02-27 DIAGNOSIS — Y929 Unspecified place or not applicable: Secondary | ICD-10-CM | POA: Diagnosis not present

## 2018-02-27 DIAGNOSIS — Z87891 Personal history of nicotine dependence: Secondary | ICD-10-CM | POA: Diagnosis not present

## 2018-02-27 DIAGNOSIS — W1789XA Other fall from one level to another, initial encounter: Secondary | ICD-10-CM | POA: Insufficient documentation

## 2018-02-27 DIAGNOSIS — R5381 Other malaise: Secondary | ICD-10-CM | POA: Diagnosis not present

## 2018-02-27 DIAGNOSIS — Y999 Unspecified external cause status: Secondary | ICD-10-CM | POA: Diagnosis not present

## 2018-02-27 DIAGNOSIS — N183 Chronic kidney disease, stage 3 (moderate): Secondary | ICD-10-CM | POA: Diagnosis not present

## 2018-02-27 DIAGNOSIS — Y939 Activity, unspecified: Secondary | ICD-10-CM | POA: Diagnosis not present

## 2018-02-27 DIAGNOSIS — Z7982 Long term (current) use of aspirin: Secondary | ICD-10-CM | POA: Diagnosis not present

## 2018-02-27 DIAGNOSIS — M25559 Pain in unspecified hip: Secondary | ICD-10-CM | POA: Diagnosis not present

## 2018-02-27 DIAGNOSIS — M25551 Pain in right hip: Secondary | ICD-10-CM | POA: Diagnosis not present

## 2018-02-27 DIAGNOSIS — I129 Hypertensive chronic kidney disease with stage 1 through stage 4 chronic kidney disease, or unspecified chronic kidney disease: Secondary | ICD-10-CM | POA: Insufficient documentation

## 2018-02-27 DIAGNOSIS — S79911A Unspecified injury of right hip, initial encounter: Secondary | ICD-10-CM | POA: Diagnosis not present

## 2018-02-27 DIAGNOSIS — W19XXXA Unspecified fall, initial encounter: Secondary | ICD-10-CM | POA: Diagnosis not present

## 2018-02-27 NOTE — Discharge Instructions (Signed)
Follow up with your family doctor.  Take tylenol for pain.  Return for worsening symptoms.

## 2018-02-27 NOTE — ED Provider Notes (Signed)
[facility]-EMERGENCY DEPT Provider Note   CSN: [id number] Arrival date & time: 02/27/18  8676     History   Chief Complaint No chief complaint on file.   HPI Austin Baker is a 78 y.o. male.  78 yo M with a chief complaint of right hip pain after fall.  He fell yesterday.  States that he has been falling every now and again usually happens when he first stands up.  He denies other injury denies head injury neck pain chest pain abdominal pain upper extremity pain.  The history is provided by the patient.  Injury  This is a new problem. The current episode started yesterday. The problem occurs constantly. The problem has been gradually worsening. Pertinent negatives include no chest pain, no abdominal pain, no headaches and no shortness of breath. The symptoms are aggravated by bending and twisting. Nothing relieves the symptoms. He has tried nothing for the symptoms. The treatment provided no relief.    Past Medical History:  Diagnosis Date  . BPH (benign prostatic hypertrophy)   . Cerebral artery occlusion with cerebral infarction (HCC) 12/28/2005   Multiple. left foot is twisted and laterally rotated secondary to his strokes.Carotids 6/99    . CVA (cerebral infarction)   . Glaucoma   . Hiccups   . Hypertension   . Hypokalemia   . HYPOMAGNESEMIA 03/31/2006  . Legally blind   . Lipoma    pedunculated on left belt line  . Renal insufficiency    Baseline Cr 1.5  . Right inguinal hernia 07/13/2011   Status post open repair 08/19/11   . Stroke (HCC)   . Urinary tract infection    hx of  . Urosepsis    history of    Patient Active Problem List   Diagnosis Date Noted  . Anemia, unspecified 11/28/2017  . Physical deconditioning 11/21/2017  . Urinary incontinence 04/04/2015  . Preventative health care 11/26/2013  . Skin tag - left hip 4 cm 07/13/2011  . BPH (benign prostatic hyperplasia) 08/04/2006  . BLINDNESS 12/28/2005  . Essential hypertension  12/28/2005  . CKD (chronic kidney disease) stage 3, GFR 30-59 ml/min (HCC) 12/28/2005    Past Surgical History:  Procedure Laterality Date  . GLAUCOMA SURGERY    . HERNIA REPAIR     as a child  . INGUINAL HERNIA REPAIR  08/19/2011   Procedure: HERNIA REPAIR INGUINAL ADULT;  Surgeon: Wilmon Arms. Corliss Skains, MD;  Location: [facility] OR;  Service: General;  Laterality: Right;        Home Medications    Prior to Admission medications   Medication Sig Start Date End Date Taking? Authorizing Provider  amLODipine (NORVASC) 10 MG tablet Take 1 tablet (10 mg total) by mouth daily. 11/18/17   Yvette Rack, MD  aspirin 81 MG tablet Take 81 mg by mouth daily.    [provider]  chlorproMAZINE (THORAZINE) 25 MG tablet Take 1 tablet (25 mg total) by mouth 2 (two) times daily. 12/29/16   Rice, Jamesetta Orleans, MD  metoprolol succinate (TOPROL-XL) 100 MG 24 hr tablet TAKE 1 TABLET BY MOUTH ONCE DAILY TAKE  WITH  OR  IMMEDIATELY  FOLLOWING  A  MEAL Patient taking differently: Take 100 mg by mouth daily.  06/27/17   Inez Catalina, MD  NON FORMULARY Banana: Eat one banana with breakfast every day, per MD's recommendation    [provider]  quinapril (ACCUPRIL) 40 MG tablet TAKE ONE TABLET BY MOUTH ONCE DAILY Patient taking  differently: Take 40 mg by mouth daily.  10/04/16   Fuller Plan, MD  triamterene-hydrochlorothiazide (MAXZIDE-25) 37.5-25 MG tablet Take 1 tablet by mouth daily. 12/29/16   Fuller Plan, MD    Family History Family History  Problem Relation Age of Onset  . Diabetes Mother   . Hypertension Mother     Social History Social History   Tobacco Use  . Smoking status: Former Smoker    Types: Cigarettes    Last attempt to quit: 01/11/1970    Years since quitting: 48.1  . Smokeless tobacco: Never Used  Substance Use Topics  . Alcohol use: No    Alcohol/week: 0.0 standard drinks  . Drug use: No     Allergies   Patient has no known allergies.   Review of  Systems Review of Systems  Constitutional: Negative for chills and fever.  HENT: Negative for congestion and facial swelling.   Eyes: Negative for discharge and visual disturbance.  Respiratory: Negative for shortness of breath.   Cardiovascular: Negative for chest pain and palpitations.  Gastrointestinal: Negative for abdominal pain, diarrhea and vomiting.  Musculoskeletal: Positive for arthralgias. Negative for myalgias.  Skin: Negative for color change and rash.  Neurological: Negative for tremors, syncope and headaches.  Psychiatric/Behavioral: Negative for confusion and dysphoric mood.     Physical Exam Updated Vital Signs BP (!) 130/100 (BP Location: Left Arm)   Pulse 83   Temp (!) 97.4 F (36.3 C) (Oral)   Resp 18   Ht 6\' 2"  (1.88 m)   Wt 77.1 kg   SpO2 98%   BMI 21.83 kg/m   Physical Exam Vitals signs and nursing note reviewed.  Constitutional:      Appearance: He is well-developed.  HENT:     Head: Normocephalic and atraumatic.  Eyes:     Comments: Disconjugate gaze  Neck:     Musculoskeletal: Normal range of motion and neck supple.     Vascular: No JVD.  Cardiovascular:     Rate and Rhythm: Normal rate and regular rhythm.     Heart sounds: No murmur. No friction rub. No gallop.   Pulmonary:     Effort: No respiratory distress.     Breath sounds: No wheezing.  Abdominal:     General: There is no distension.     Tenderness: There is no abdominal tenderness. There is no guarding or rebound.  Musculoskeletal: Normal range of motion.     Comments: I am able to internally and externally rotate the right lower extremity without significant hip pain.  Pulse motor and sensation are intact distally.  Skin:    Coloration: Skin is not pale.     Findings: No rash.  Neurological:     Mental Status: He is alert and oriented to person, place, and time.  Psychiatric:        Behavior: Behavior normal.      ED Treatments / Results  Labs (all labs ordered are  listed, but only abnormal results are displayed) Labs Reviewed - No data to display  EKG None  Radiology Dg Hip Unilat W Or Wo Pelvis 2-3 Views Right  Result Date: 02/27/2018 CLINICAL DATA:  RIGHT hip pain. Fall. EXAM: DG HIP (WITH OR WITHOUT PELVIS) 2-3V RIGHT COMPARISON:  None. 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 Sclerosis with complete loss of joint space. Bony overgrowth. No definite fracture. 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 Electronically Signed   By: Dale St. Henry.D.  On: 02/27/2018 13:06    Procedures Procedures (including critical care time)  Medications Ordered in ED Medications - No data to display   Initial Impression / Assessment and Plan / ED Course  I have reviewed the triage vital signs and the nursing notes.  Pertinent labs & imaging results that were available during my care of the patient were reviewed by me and considered in my medical decision making (see chart for details).     78 yo M with a chief complaint of right lower extremity pain.  Occurred after he fell yesterday.  No obvious signs of trauma on my exam.  Will obtain a plain film of the hip.  Plain film the hip viewed by me negative.  Discharge home.  1:16 PM:  I have discussed the diagnosis/risks/treatment options with the patient and family and believe the pt to be eligible for discharge home to follow-up with PCP. We also discussed returning to the ED immediately if new or worsening sx occur. We discussed the sx which are most concerning (e.g., sudden worsening pain, fever, inability to tolerate by mouth) that necessitate immediate return. Medications administered to the patient during their visit and any new prescriptions provided to the patient are listed below.  Medications given during this visit Medications - No data to display   The patient appears reasonably screen and/or stabilized for discharge and I doubt any other medical  condition or other [facility] requiring further screening, evaluation, or treatment in the ED at this time prior to discharge.    Final Clinical Impressions(s) / ED Diagnoses   Final diagnoses:  Right hip pain    ED Discharge Orders    None       Melene PlanFloyd, Adrin Julian, DO 02/27/18 1316

## 2018-02-27 NOTE — ED Triage Notes (Signed)
Per EMS: Pt had a fall last night with no LOC.  L side deficits from previous stroke.  Pt c/o of R hip pain, upon waking this am.  Pt is blind.

## 2018-02-27 NOTE — ED Notes (Signed)
Pt blind and unable to sign for D/C, verbal consent for signature obtained.

## 2018-02-27 NOTE — ED Notes (Signed)
Attempted to call patients sister, Oluwatoni Melendrez, for a ride home. Unable to reach her, but left a message for her.

## 2018-02-27 NOTE — ED Notes (Signed)
Bed: [facility] Expected date:  Expected time:  Means of arrival:  Comments: EMS fall no deformity

## 2018-02-27 NOTE — ED Notes (Signed)
Changed patients diaper.

## 2018-06-01 DIAGNOSIS — N183 Chronic kidney disease, stage 3 (moderate): Secondary | ICD-10-CM | POA: Diagnosis not present

## 2018-06-01 DIAGNOSIS — Z7902 Long term (current) use of antithrombotics/antiplatelets: Secondary | ICD-10-CM | POA: Diagnosis not present

## 2018-06-01 DIAGNOSIS — I10 Essential (primary) hypertension: Secondary | ICD-10-CM | POA: Diagnosis not present

## 2018-06-01 DIAGNOSIS — Z7189 Other specified counseling: Secondary | ICD-10-CM | POA: Diagnosis not present

## 2018-07-03 ENCOUNTER — Other Ambulatory Visit: Payer: Self-pay | Admitting: Internal Medicine

## 2018-07-03 DIAGNOSIS — I10 Essential (primary) hypertension: Secondary | ICD-10-CM

## 2018-07-07 DIAGNOSIS — Z760 Encounter for issue of repeat prescription: Secondary | ICD-10-CM | POA: Diagnosis not present

## 2018-07-07 DIAGNOSIS — M6281 Muscle weakness (generalized): Secondary | ICD-10-CM | POA: Diagnosis not present

## 2018-07-07 DIAGNOSIS — Z7902 Long term (current) use of antithrombotics/antiplatelets: Secondary | ICD-10-CM | POA: Diagnosis not present

## 2018-07-07 DIAGNOSIS — N183 Chronic kidney disease, stage 3 (moderate): Secondary | ICD-10-CM | POA: Diagnosis not present

## 2018-07-07 DIAGNOSIS — I10 Essential (primary) hypertension: Secondary | ICD-10-CM | POA: Diagnosis not present

## 2018-08-09 DIAGNOSIS — I10 Essential (primary) hypertension: Secondary | ICD-10-CM | POA: Diagnosis not present

## 2018-08-09 DIAGNOSIS — Z0000 Encounter for general adult medical examination without abnormal findings: Secondary | ICD-10-CM | POA: Diagnosis not present

## 2018-08-09 DIAGNOSIS — R066 Hiccough: Secondary | ICD-10-CM | POA: Diagnosis not present

## 2018-08-09 DIAGNOSIS — Z7902 Long term (current) use of antithrombotics/antiplatelets: Secondary | ICD-10-CM | POA: Diagnosis not present

## 2018-09-06 DIAGNOSIS — I10 Essential (primary) hypertension: Secondary | ICD-10-CM | POA: Diagnosis not present

## 2018-09-06 DIAGNOSIS — Z7902 Long term (current) use of antithrombotics/antiplatelets: Secondary | ICD-10-CM | POA: Diagnosis not present

## 2018-09-06 DIAGNOSIS — N183 Chronic kidney disease, stage 3 (moderate): Secondary | ICD-10-CM | POA: Diagnosis not present

## 2018-11-04 DIAGNOSIS — Z23 Encounter for immunization: Secondary | ICD-10-CM | POA: Diagnosis not present

## 2018-12-02 DIAGNOSIS — Q845 Enlarged and hypertrophic nails: Secondary | ICD-10-CM | POA: Diagnosis not present

## 2018-12-02 DIAGNOSIS — Z7902 Long term (current) use of antithrombotics/antiplatelets: Secondary | ICD-10-CM | POA: Diagnosis not present

## 2018-12-02 DIAGNOSIS — I10 Essential (primary) hypertension: Secondary | ICD-10-CM | POA: Diagnosis not present

## 2019-03-03 DIAGNOSIS — N183 Chronic kidney disease, stage 3 unspecified: Secondary | ICD-10-CM | POA: Diagnosis not present

## 2019-03-03 DIAGNOSIS — Q845 Enlarged and hypertrophic nails: Secondary | ICD-10-CM | POA: Diagnosis not present

## 2019-03-03 DIAGNOSIS — I10 Essential (primary) hypertension: Secondary | ICD-10-CM | POA: Diagnosis not present

## 2019-03-03 DIAGNOSIS — Z7902 Long term (current) use of antithrombotics/antiplatelets: Secondary | ICD-10-CM | POA: Diagnosis not present

## 2019-03-09 DIAGNOSIS — M2041 Other hammer toe(s) (acquired), right foot: Secondary | ICD-10-CM | POA: Diagnosis not present

## 2019-03-09 DIAGNOSIS — M2011 Hallux valgus (acquired), right foot: Secondary | ICD-10-CM | POA: Diagnosis not present

## 2019-03-09 DIAGNOSIS — B351 Tinea unguium: Secondary | ICD-10-CM | POA: Diagnosis not present

## 2019-08-12 DEATH — deceased

## 2020-10-03 IMAGING — CT CT HEAD W/O CM
3 of 4 series · 13 of 47 positions shown, 15 images · non-contrast
Comparison: Head CT 07/18/2015.

CLINICAL DATA: 77-year-old male with history of right-sided
weakness from a stroke in the past. Right leg weaker than usual.

EXAM:
CT HEAD WITHOUT CONTRAST
TECHNIQUE: Contiguous axial images were obtained from the base of the skull
through the vertex without intravenous contrast.

[Series 3: head without · axial · non-contrast · 0.46mm/px · z∈[-118,+2]mm · 7 of 34 slices shown, 9 images]
[im 5/34  brain]
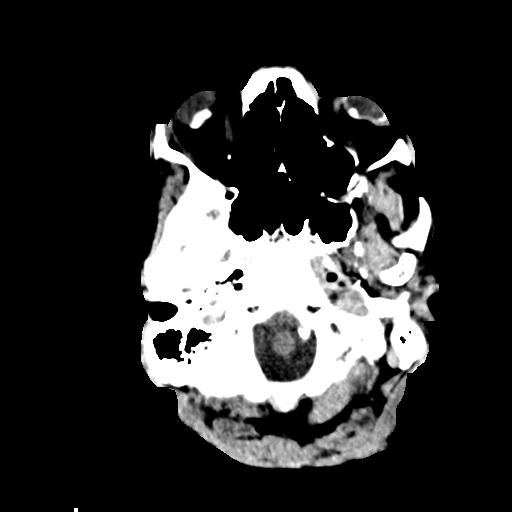
[im 5/34  bone]
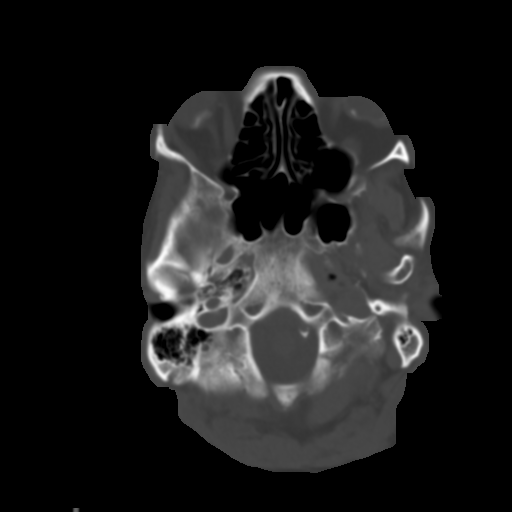
[im 9/34  brain]
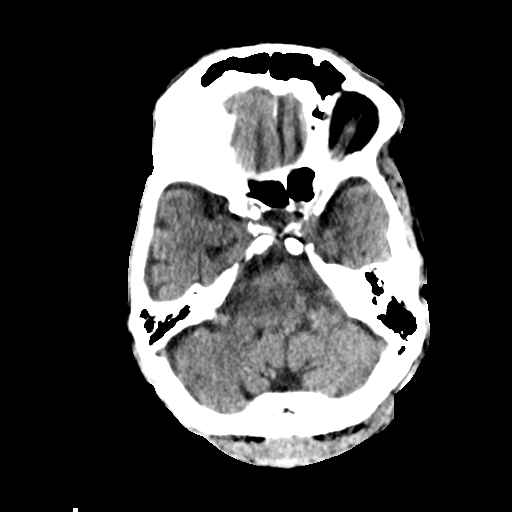
[im 13/34  brain]
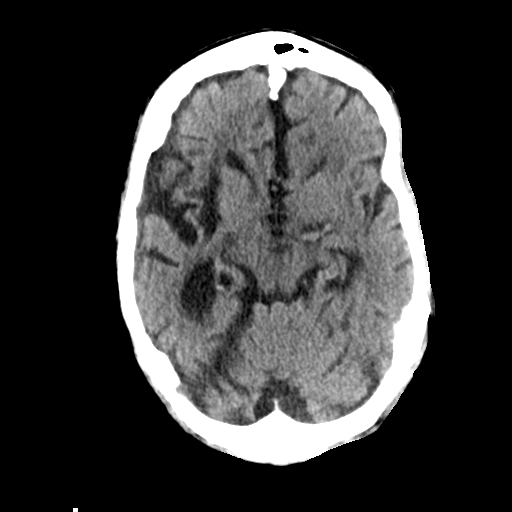
[im 17/34  brain]
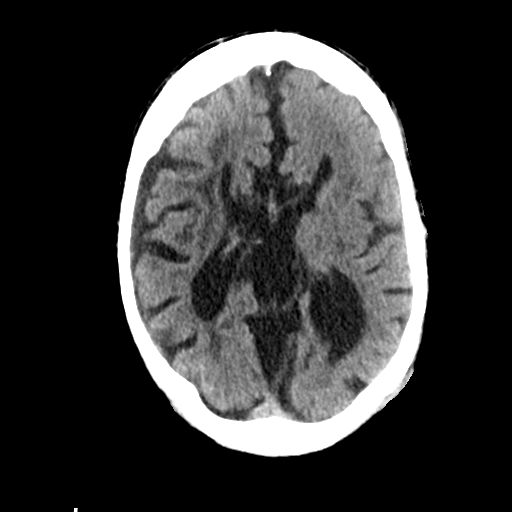
[im 21/34  brain]
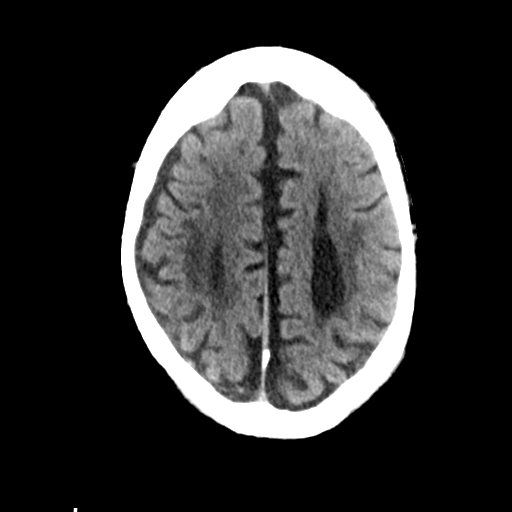
[im 21/34  bone]
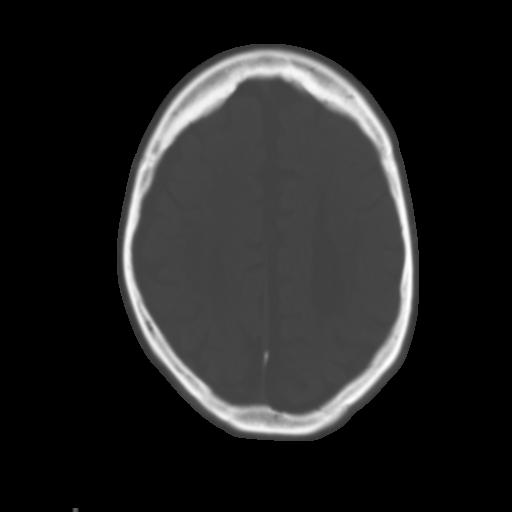
[im 25/34  brain]
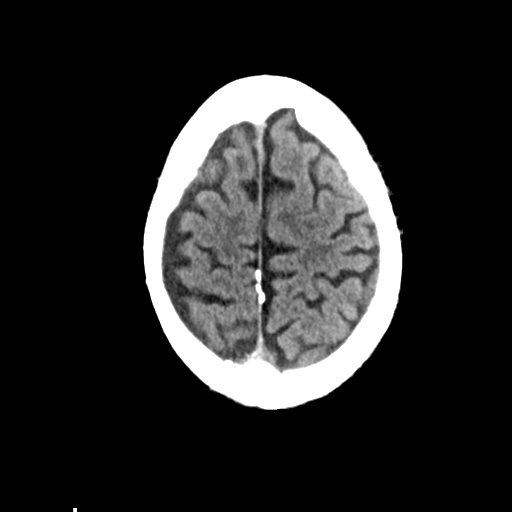
[im 29/34  brain]
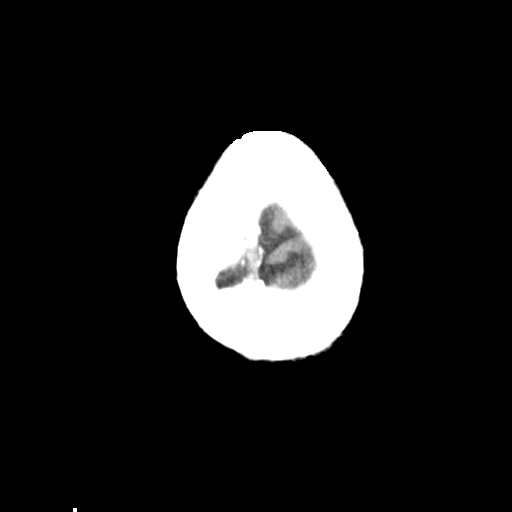

[Series 5: head without cor · coronal · non-contrast · 0.34mm/px · 3 of 73 slices shown]
[im 25/73  brain]
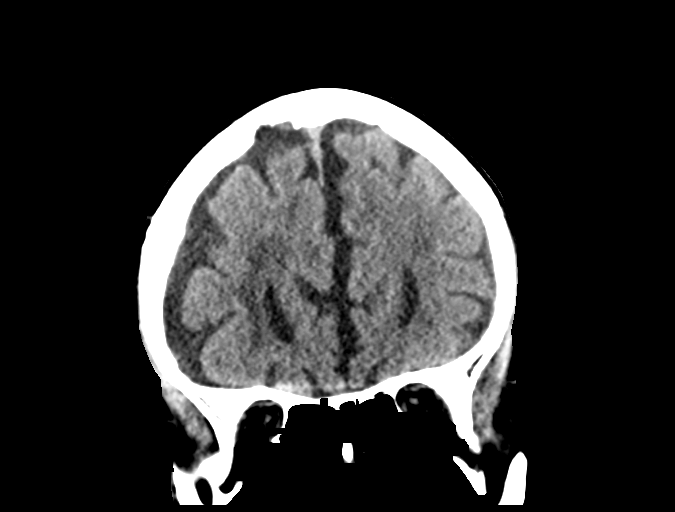
[im 33/73  brain]
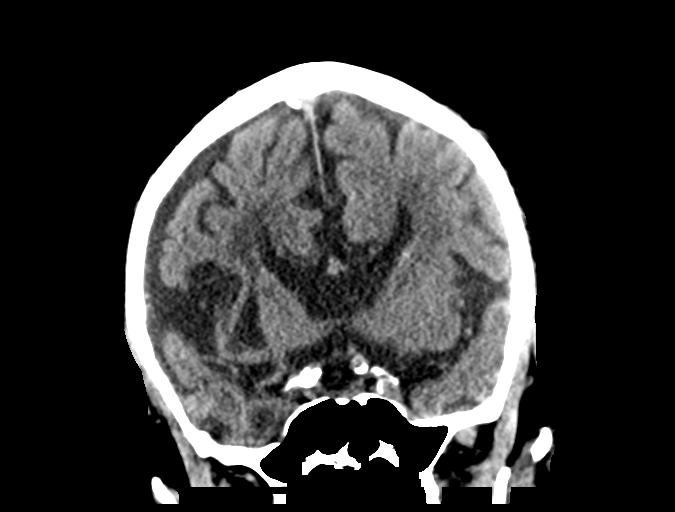
[im 41/73  brain]
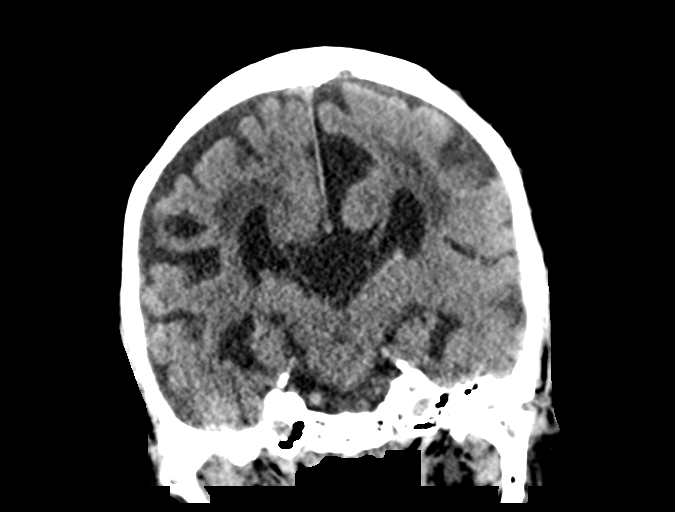

[Series 6: head without sag · sagittal · non-contrast · 0.33mm/px · 3 of 67 slices shown]
[im 23/67  brain]
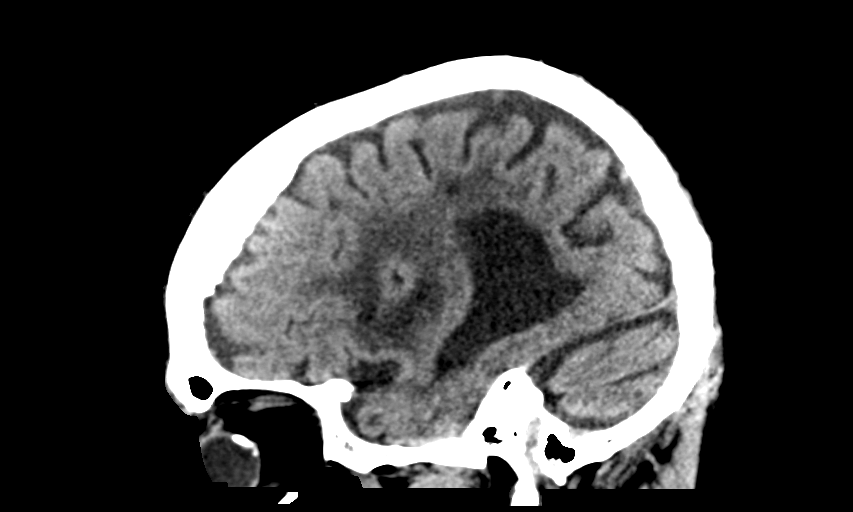
[im 34/67  brain]
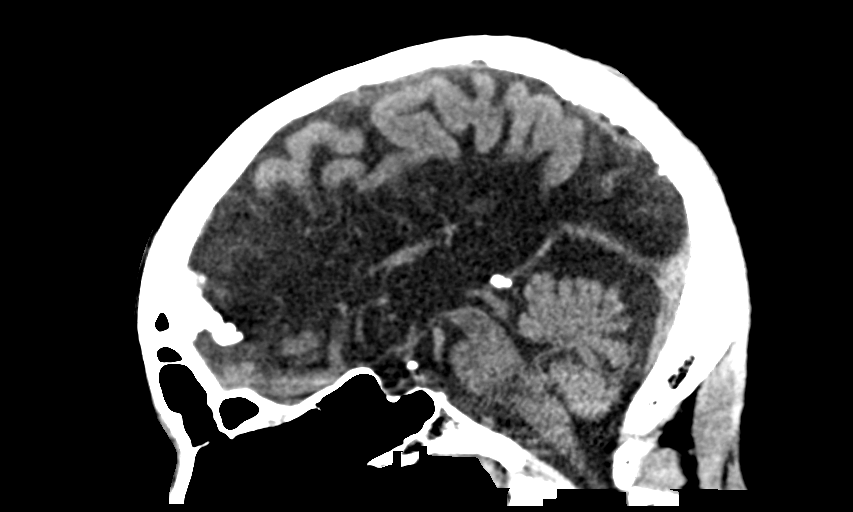
[im 45/67  brain]
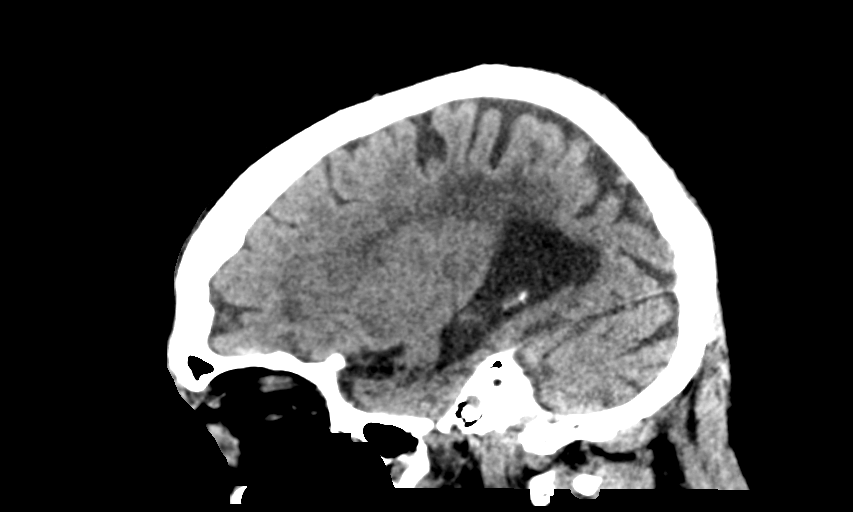

[13 of 47 positions shown; findings below may reference images not displayed]

FINDINGS: Brain: Moderate cerebral atrophy. Patchy and confluent areas of
decreased attenuation are noted throughout the deep and
periventricular white matter of the cerebral hemispheres bilaterally
(right greater than left), compatible with chronic microvascular
ischemic disease. Focal encephalomalacia/gliosis in the right
external capsule from remote infarct. Small lacunar infarct in the
right basal ganglia. No evidence of acute infarction, hemorrhage,
hydrocephalus, extra-axial collection or mass lesion/mass effect.

Vascular: No hyperdense vessel or unexpected calcification.

Skull: Normal. Negative for fracture or focal lesion.

Sinuses/Orbits: No acute finding.

Other: None.
IMPRESSION: 1. No acute intracranial abnormalities.
2. Moderate cerebral atrophy with chronic ischemic changes, similar
to the prior study, as above.

## 2020-10-08 IMAGING — DX DG CHEST 2V
2 series · 2 of 2 positions shown · non-contrast
Comparison: 11/20/2017

CLINICAL DATA: Fever, hypertension, tachycardia

EXAM:
CHEST - 2 VIEW

[chest ap]
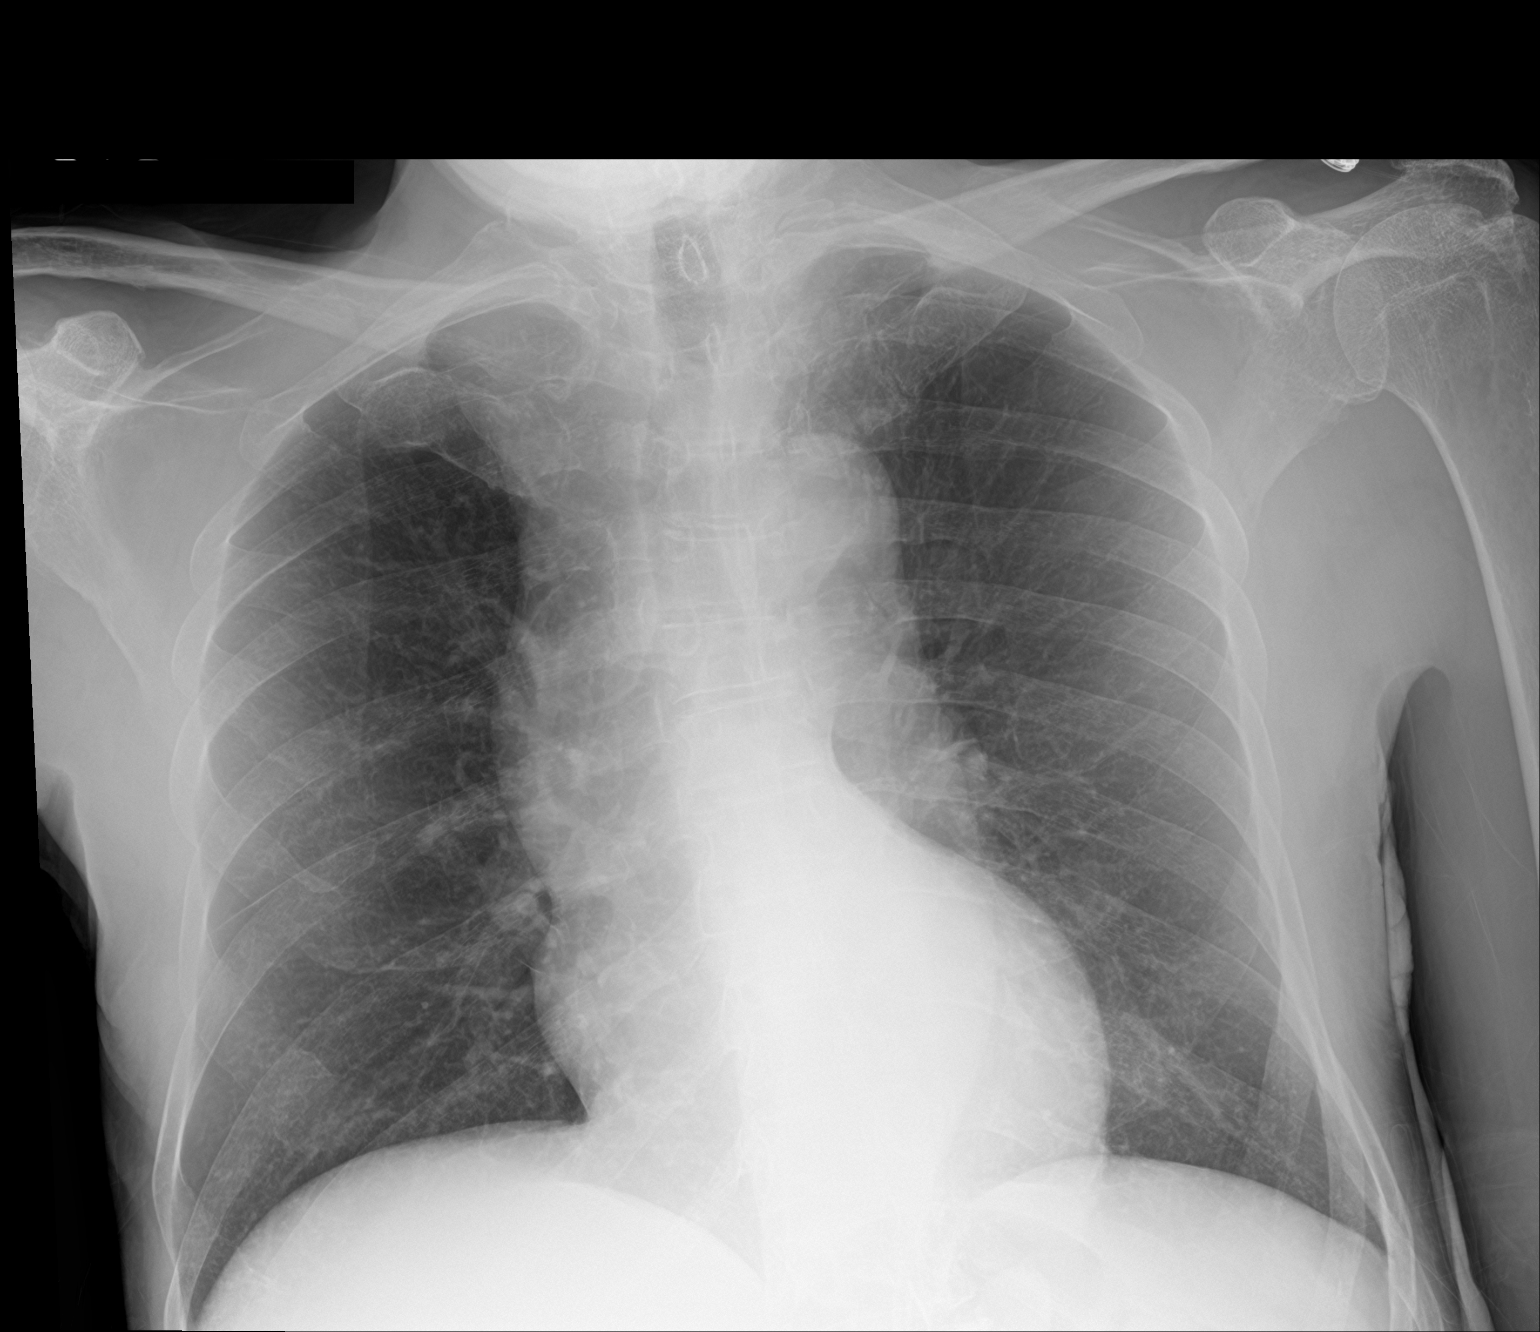

[chest lat]
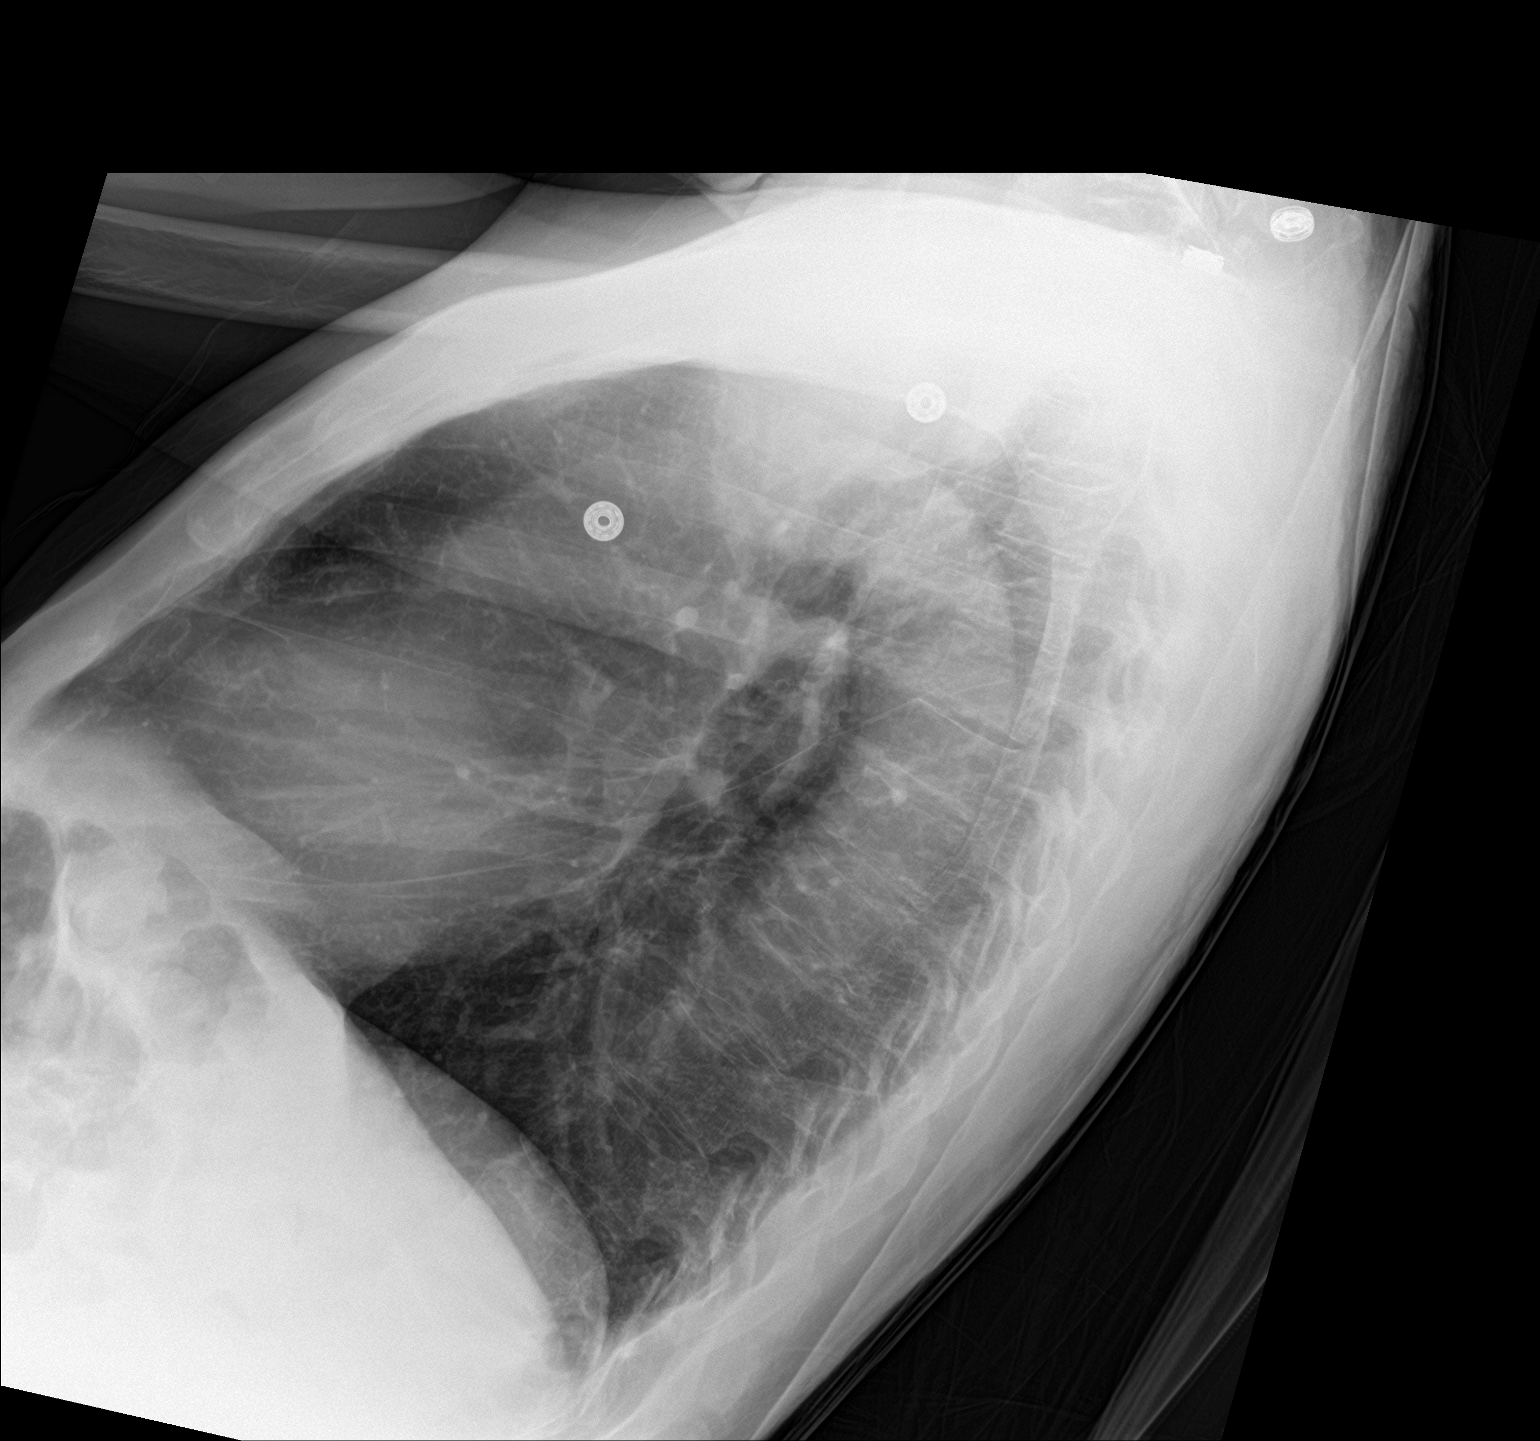

[2 of 2 positions shown; findings below may reference images not displayed]

FINDINGS: Lungs remain clear. No focal pneumonia, collapse or consolidation.
Negative for edema, effusion or pneumothorax. Trachea is midline.
Normal heart size. Aorta is atherosclerotic and ectatic with slight
tortuosity.
IMPRESSION: No acute chest process.

Thoracic aortic ectasia and atherosclerosis
